# Patient Record
Sex: Male | Born: 1965 | Race: White | Hispanic: No | Marital: Married | Smoking: Never smoker
Health system: Southern US, Community
[De-identification: ages and names within clinical notes are randomized; demographics above are authoritative.]

## PROBLEM LIST (undated history)

## (undated) DIAGNOSIS — Z905 Acquired absence of kidney: Secondary | ICD-10-CM

## (undated) DIAGNOSIS — K219 Gastro-esophageal reflux disease without esophagitis: Secondary | ICD-10-CM

## (undated) DIAGNOSIS — E785 Hyperlipidemia, unspecified: Secondary | ICD-10-CM

## (undated) DIAGNOSIS — E119 Type 2 diabetes mellitus without complications: Secondary | ICD-10-CM

## (undated) HISTORY — DX: Acquired absence of kidney: Z90.5

## (undated) HISTORY — DX: Hyperlipidemia, unspecified: E78.5

## (undated) HISTORY — PX: KIDNEY DONATION: SHX685

## (undated) HISTORY — DX: Type 2 diabetes mellitus without complications: E11.9

## (undated) HISTORY — DX: Gastro-esophageal reflux disease without esophagitis: K21.9

---

## 2011-10-25 ENCOUNTER — Emergency Department: Admission: EM | Admit: 2011-10-25 | Discharge: 2011-10-25 | Payer: 59 | Source: Home / Self Care

## 2014-07-22 DIAGNOSIS — E785 Hyperlipidemia, unspecified: Secondary | ICD-10-CM

## 2014-07-22 DIAGNOSIS — E559 Vitamin D deficiency, unspecified: Secondary | ICD-10-CM | POA: Insufficient documentation

## 2014-07-22 DIAGNOSIS — K219 Gastro-esophageal reflux disease without esophagitis: Secondary | ICD-10-CM | POA: Insufficient documentation

## 2014-07-22 DIAGNOSIS — R7303 Prediabetes: Secondary | ICD-10-CM | POA: Insufficient documentation

## 2014-07-22 DIAGNOSIS — Z905 Acquired absence of kidney: Secondary | ICD-10-CM

## 2014-07-22 HISTORY — DX: Acquired absence of kidney: Z90.5

## 2014-07-22 HISTORY — DX: Hyperlipidemia, unspecified: E78.5

## 2015-10-06 ENCOUNTER — Ambulatory Visit (INDEPENDENT_AMBULATORY_CARE_PROVIDER_SITE_OTHER): Payer: Managed Care, Other (non HMO) | Admitting: Family Medicine

## 2015-10-06 ENCOUNTER — Encounter: Payer: Self-pay | Admitting: Family Medicine

## 2015-10-06 VITALS — BP 127/86 | HR 65 | Ht 63.0 in | Wt 158.0 lb

## 2015-10-06 DIAGNOSIS — K219 Gastro-esophageal reflux disease without esophagitis: Secondary | ICD-10-CM | POA: Diagnosis not present

## 2015-10-06 DIAGNOSIS — M7712 Lateral epicondylitis, left elbow: Secondary | ICD-10-CM

## 2015-10-06 DIAGNOSIS — Z905 Acquired absence of kidney: Secondary | ICD-10-CM

## 2015-10-06 DIAGNOSIS — M7710 Lateral epicondylitis, unspecified elbow: Secondary | ICD-10-CM | POA: Insufficient documentation

## 2015-10-06 DIAGNOSIS — E559 Vitamin D deficiency, unspecified: Secondary | ICD-10-CM

## 2015-10-06 DIAGNOSIS — R7303 Prediabetes: Secondary | ICD-10-CM

## 2015-10-06 DIAGNOSIS — E785 Hyperlipidemia, unspecified: Secondary | ICD-10-CM

## 2015-10-06 NOTE — Patient Instructions (Signed)
Thank you for coming in today. Get fasting labs soon.  Do the exercises we discussed as much as possible.  Try using a counter-force brace for your elbow.  Call if not better in 4 weeks and we will get you to hand therapy.  Continue diet and exercise.  Tumeric can be helpful as well sometimes.   Lateral Epicondylitis With Rehab Lateral epicondylitis involves inflammation and pain around the outer portion of the elbow. The pain is caused by inflammation of the tendons in the forearm that bring back (extend) the wrist. Lateral epicondylitis is also called tennis elbow, because it is very common in tennis players. However, it may occur in any individual who extends the wrist repetitively. If lateral epicondylitis is left untreated, it may become a chronic problem. SYMPTOMS   Pain, tenderness, and inflammation on the outer (lateral) side of the elbow.  Pain or weakness with gripping activities.  Pain that increases with wrist-twisting motions (playing tennis, using a screwdriver, opening a door or a jar).  Pain with lifting objects, including a coffee cup. CAUSES  Lateral epicondylitis is caused by inflammation of the tendons that extend the wrist. Causes of injury may include:  Repetitive stress and strain on the muscles and tendons that extend the wrist.  Sudden change in activity level or intensity.  Incorrect grip in racquet sports.  Incorrect grip size of racquet (often too large).  Incorrect hitting position or technique (usually backhand, leading with the elbow).  Using a racket that is too heavy. RISK INCREASES WITH:  Sports or occupations that require repetitive and/or strenuous forearm and wrist movements (tennis, squash, racquetball, carpentry).  Poor wrist and forearm strength and flexibility.  Failure to warm up properly before activity.  Resuming activity before healing, rehabilitation, and conditioning are complete. PREVENTION   Warm up and stretch properly  before activity.  Maintain physical fitness:  Strength, flexibility, and endurance.  Cardiovascular fitness.  Wear and use properly fitted equipment.  Learn and use proper technique and have a coach correct improper technique.  Wear a tennis elbow (counterforce) brace. PROGNOSIS  The course of this condition depends on the degree of the injury. If treated properly, acute cases (symptoms lasting less than 4 weeks) are often resolved in 2 to 6 weeks. Chronic (longer lasting cases) often resolve in 3 to 6 months but may require physical therapy. RELATED COMPLICATIONS   Frequently recurring symptoms, resulting in a chronic problem. Properly treating the problem the first time decreases frequency of recurrence.  Chronic inflammation, scarring tendon degeneration, and partial tendon tear, requiring surgery.  Delayed healing or resolution of symptoms. TREATMENT  Treatment first involves the use of ice and medicine to reduce pain and inflammation. Strengthening and stretching exercises may help reduce discomfort if performed regularly. These exercises may be performed at home if the condition is an acute injury. Chronic cases may require a referral to a physical therapist for evaluation and treatment. Your caregiver may advise a corticosteroid injection to help reduce inflammation. Rarely, surgery is needed. MEDICATION  If pain medicine is needed, nonsteroidal anti-inflammatory medicines (aspirin and ibuprofen), or other minor pain relievers (acetaminophen), are often advised.  Do not take pain medicine for 7 days before surgery.  Prescription pain relievers may be given, if your caregiver thinks they are needed. Use only as directed and only as much as you need.  Corticosteroid injections may be recommended. These injections should be reserved only for the most severe cases, because they can only be given a certain  number of times. HEAT AND COLD  Cold treatment (icing) should be applied  for 10 to 15 minutes every 2 to 3 hours for inflammation and pain, and immediately after activity that aggravates your symptoms. Use ice packs or an ice massage.  Heat treatment may be used before performing stretching and strengthening activities prescribed by your caregiver, physical therapist, or athletic trainer. Use a heat pack or a warm water soak. SEEK MEDICAL CARE IF: Symptoms get worse or do not improve in 2 weeks, despite treatment. EXERCISES  RANGE OF MOTION (ROM) AND STRETCHING EXERCISES - Epicondylitis, Lateral (Tennis Elbow) These exercises may help you when beginning to rehabilitate your injury. Your symptoms may go away with or without further involvement from your physician, physical therapist, or athletic trainer. While completing these exercises, remember:   Restoring tissue flexibility helps normal motion to return to the joints. This allows healthier, less painful movement and activity.  An effective stretch should be held for at least 30 seconds.  A stretch should never be painful. You should only feel a gentle lengthening or release in the stretched tissue. RANGE OF MOTION - Wrist Flexion, Active-Assisted  Extend your right / left elbow with your fingers pointing down.*  Gently pull the back of your hand towards you, until you feel a gentle stretch on the top of your forearm.  Hold this position for __________ seconds. Repeat __________ times. Complete this exercise __________ times per day.  *If directed by your physician, physical therapist or athletic trainer, complete this stretch with your elbow bent, rather than extended. RANGE OF MOTION - Wrist Extension, Active-Assisted  Extend your right / left elbow and turn your palm upwards.*  Gently pull your palm and fingertips back, so your wrist extends and your fingers point more toward the ground.  You should feel a gentle stretch on the inside of your forearm.  Hold this position for __________ seconds. Repeat  __________ times. Complete this exercise __________ times per day. *If directed by your physician, physical therapist or athletic trainer, complete this stretch with your elbow bent, rather than extended. STRETCH - Wrist Flexion  Place the back of your right / left hand on a tabletop, leaving your elbow slightly bent. Your fingers should point away from your body.  Gently press the back of your hand down onto the table by straightening your elbow. You should feel a stretch on the top of your forearm.  Hold this position for __________ seconds. Repeat __________ times. Complete this stretch __________ times per day.  STRETCH - Wrist Extension   Place your right / left fingertips on a tabletop, leaving your elbow slightly bent. Your fingers should point backwards.  Gently press your fingers and palm down onto the table by straightening your elbow. You should feel a stretch on the inside of your forearm.  Hold this position for __________ seconds. Repeat __________ times. Complete this stretch __________ times per day.  STRENGTHENING EXERCISES - Epicondylitis, Lateral (Tennis Elbow) These exercises may help you when beginning to rehabilitate your injury. They may resolve your symptoms with or without further involvement from your physician, physical therapist, or athletic trainer. While completing these exercises, remember:   Muscles can gain both the endurance and the strength needed for everyday activities through controlled exercises.  Complete these exercises as instructed by your physician, physical therapist or athletic trainer. Increase the resistance and repetitions only as guided.  You may experience muscle soreness or fatigue, but the pain or discomfort you are trying  to eliminate should never worsen during these exercises. If this pain does get worse, stop and make sure you are following the directions exactly. If the pain is still present after adjustments, discontinue the exercise  until you can discuss the trouble with your caregiver. STRENGTH - Wrist Flexors  Sit with your right / left forearm palm-up and fully supported on a table or countertop. Your elbow should be resting below the height of your shoulder. Allow your wrist to extend over the edge of the surface.  Loosely holding a __________ weight, or a piece of rubber exercise band or tubing, slowly curl your hand up toward your forearm.  Hold this position for __________ seconds. Slowly lower the wrist back to the starting position in a controlled manner. Repeat __________ times. Complete this exercise __________ times per day.  STRENGTH - Wrist Extensors  Sit with your right / left forearm palm-down and fully supported on a table or countertop. Your elbow should be resting below the height of your shoulder. Allow your wrist to extend over the edge of the surface.  Loosely holding a __________ weight, or a piece of rubber exercise band or tubing, slowly curl your hand up toward your forearm.  Hold this position for __________ seconds. Slowly lower the wrist back to the starting position in a controlled manner. Repeat __________ times. Complete this exercise __________ times per day.  STRENGTH - Ulnar Deviators  Stand with a ____________________ weight in your right / left hand, or sit while holding a rubber exercise band or tubing, with your healthy arm supported on a table or countertop.  Move your wrist, so that your pinkie travels toward your forearm and your thumb moves away from your forearm.  Hold this position for __________ seconds and then slowly lower the wrist back to the starting position. Repeat __________ times. Complete this exercise __________ times per day STRENGTH - Radial Deviators  Stand with a ____________________ weight in your right / left hand, or sit while holding a rubber exercise band or tubing, with your injured arm supported on a table or countertop.  Raise your hand upward in  front of you or pull up on the rubber tubing.  Hold this position for __________ seconds and then slowly lower the wrist back to the starting position. Repeat __________ times. Complete this exercise __________ times per day. STRENGTH - Forearm Supinators   Sit with your right / left forearm supported on a table, keeping your elbow below shoulder height. Rest your hand over the edge, palm down.  Gently grip a hammer or a soup ladle.  Without moving your elbow, slowly turn your palm and hand upward to a "thumbs-up" position.  Hold this position for __________ seconds. Slowly return to the starting position. Repeat __________ times. Complete this exercise __________ times per day.  STRENGTH - Forearm Pronators   Sit with your right / left forearm supported on a table, keeping your elbow below shoulder height. Rest your hand over the edge, palm up.  Gently grip a hammer or a soup ladle.  Without moving your elbow, slowly turn your palm and hand upward to a "thumbs-up" position.  Hold this position for __________ seconds. Slowly return to the starting position. Repeat __________ times. Complete this exercise __________ times per day.  STRENGTH - Grip  Grasp a tennis ball, a dense sponge, or a large, rolled sock in your hand.  Squeeze as hard as you can, without increasing any pain.  Hold this position for __________  seconds. Release your grip slowly. Repeat __________ times. Complete this exercise __________ times per day.  STRENGTH - Elbow Extensors, Isometric  Stand or sit upright, on a firm surface. Place your right / left arm so that your palm faces your stomach, and it is at the height of your waist.  Place your opposite hand on the underside of your forearm. Gently push up as your right / left arm resists. Push as hard as you can with both arms, without causing any pain or movement at your right / left elbow. Hold this stationary position for __________ seconds. Gradually  release the tension in both arms. Allow your muscles to relax completely before repeating.   This information is not intended to replace advice given to you by your health care provider. Make sure you discuss any questions you have with your health care provider.   Document Released: 03/12/2005 Document Revised: 04/02/2014 Document Reviewed: 06/24/2008 Elsevier Interactive Patient Education [employer]

## 2015-10-06 NOTE — Progress Notes (Signed)
       Darius RousJohn Roberts is a 50 y.o. male who [facility] today to establish care.  He is doing well.  His only complaint is left lateral elbow pain for the past 3 months that he describes as a nagging soreness.  Denies radiation of pain, numbness, tingling, or weakness.  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  He claims te pain is worse after playing his guitar and riding his bike uphill.  Denies specific injury. Patient has been applying ice and heat and taking ibuprofen occasionally with some improvement.  Patient is right handed.  His past medical history is significant for pre-diabetes which he says has been improving over the last several years due to improved diet and exercise habits. 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 He does have a history of hyperlipidemia.     History reviewed. No pertinent past medical history. Past Surgical History  Procedure Laterality Date  . Kidney donation Right    Social History  Substance Use Topics  . Smoking status: Never Smoker   . Smokeless tobacco: Not on file  . Alcohol Use: 0.0 oz/week    0 Standard drinks or equivalent per week   family history includes Diabetes in his mother; Heart disease in his father; Hyperlipidemia in his paternal uncle; Hypertension in his maternal uncle.  ROS as above: Denies headache, fever, chills, chest pain, shortness of breath, abdominal pain, nausea, vomiting, diarrhea, and lower extremity edema.    Medications: No current outpatient prescriptions on file.   No current facility-administered medications for this visit.   Allergies  Allergen Reactions  . Amoxicillin Rash  . Promethazine Other (See Comments)     Exam:  BP 127/86 mmHg  Pulse 65  Ht 5\' 3"  (1.6 m)  Wt 158 lb (71.668 kg)  BMI 28.00 kg/m2 Gen: Well  NAD HEENT: EOMI,  MMM Lungs: Normal work of breathing. CTABL Heart: RRR no MRG Abd: NABS, Soft. Nondistended, Nontender Exts: Brisk capillary refill, warm and well perfused.  MSK left elbow: Normal appearance Normal motion Tender to palpation lateral epicondyle Pain with resisted left wrist extension with a fully extended elbow.   Normal strength and sensation.    No results found for this or any previous visit (from the past 24 hour(s)). No results found.  Assessment and Plan: 50 y.o. 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  Also on the differential is osteoarthritis and cervical radiculopathy, but these are unlikely given his age and lack of radicular pain or neurological deficits. - Advised the patient to perform eccentric strengthening exercises and continue his current activities as tolerated.   Additionally we'll use counter force brace - Follow up in 4 weeks if not improved for formal PT referral.    - A1c, fasting lipids, CMP, CBC, TSH, Vitamin D  Discussed warning signs or symptoms. Please see discharge instructions. Patient expresses understanding.

## 2016-05-15 ENCOUNTER — Telehealth: Payer: Self-pay | Admitting: Family Medicine

## 2016-05-15 DIAGNOSIS — K219 Gastro-esophageal reflux disease without esophagitis: Secondary | ICD-10-CM

## 2016-05-15 DIAGNOSIS — E785 Hyperlipidemia, unspecified: Secondary | ICD-10-CM

## 2016-05-15 DIAGNOSIS — E559 Vitamin D deficiency, unspecified: Secondary | ICD-10-CM

## 2016-05-15 DIAGNOSIS — R7303 Prediabetes: Secondary | ICD-10-CM

## 2016-05-15 DIAGNOSIS — Z0000 Encounter for general adult medical examination without abnormal findings: Secondary | ICD-10-CM

## 2016-05-15 DIAGNOSIS — Z905 Acquired absence of kidney: Secondary | ICD-10-CM

## 2016-05-15 DIAGNOSIS — Z114 Encounter for screening for human immunodeficiency virus [HIV]: Secondary | ICD-10-CM

## 2016-05-15 NOTE — Telephone Encounter (Signed)
Patient called and scheduled a physical for 05/25/16 at 8:45 am but would like to get basic labs done before and discuss within physical. Please adv

## 2016-05-16 NOTE — Telephone Encounter (Signed)
Labs ordered and will be ready for pickup.

## 2016-05-17 NOTE — Telephone Encounter (Signed)
Information discussed with pt. Pt verbalized understanding. 

## 2016-05-25 ENCOUNTER — Ambulatory Visit (INDEPENDENT_AMBULATORY_CARE_PROVIDER_SITE_OTHER): Payer: Managed Care, Other (non HMO) | Admitting: Family Medicine

## 2016-05-25 ENCOUNTER — Encounter: Payer: Self-pay | Admitting: Family Medicine

## 2016-05-25 VITALS — BP 134/64 | HR 56 | Wt 159.0 lb

## 2016-05-25 DIAGNOSIS — Z0000 Encounter for general adult medical examination without abnormal findings: Secondary | ICD-10-CM

## 2016-05-25 DIAGNOSIS — Z23 Encounter for immunization: Secondary | ICD-10-CM

## 2016-05-25 LAB — CBC
HEMATOCRIT: 44.6 % (ref 38.5–50.0)
HEMOGLOBIN: 15 g/dL (ref 13.2–17.1)
MCH: 30.8 pg (ref 27.0–33.0)
MCHC: 33.6 g/dL (ref 32.0–36.0)
MCV: 91.6 fL (ref 80.0–100.0)
MPV: 9.8 fL (ref 7.5–12.5)
Platelets: 220 10*3/uL (ref 140–400)
RBC: 4.87 MIL/uL (ref 4.20–5.80)
RDW: 13.4 % (ref 11.0–15.0)
WBC: 6 10*3/uL (ref 3.8–10.8)

## 2016-05-25 NOTE — Progress Notes (Signed)
       Darius Roberts is a 51 y.o. male who [facility] Medicine today for well adult visit. Patient presents to clinic today for wellness visit. He is doing generally well with no significant medical problems. He notes occasional left arm radiating pain but he denies any weakness or numbness. He denies any fevers or chills and feels well otherwise.  Patient is active and exercises regularly. He plays the guitar for recreation. He manages a [employer], [employer] store.   Past Medical History:  Diagnosis Date  . 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 Dyslipidemia 07/22/2014   Past Surgical History:  Procedure Laterality Date  . KIDNEY DONATION Right    Social History  Substance Use Topics  . Smoking status: Never Smoker  . Smokeless tobacco: Never Used  . Alcohol use 0.0 oz/week   family history includes Diabetes in his mother; Heart disease in his father; Hyperlipidemia in his paternal uncle; Hypertension in his maternal uncle.  ROS as above:  Medications: Current Outpatient Prescriptions  Medication Sig Dispense Refill  . Ascorbic Acid (VITAMIN C) 100 MG tablet Take 100 mg by mouth daily.    . cholecalciferol (VITAMIN D) 1000 units tablet Take 1,000 Units by mouth daily.    . magnesium citrate SOLN Take 1 Bottle by mouth once.    . Multiple Vitamin (MULTIVITAMIN) capsule Take 1 capsule by mouth daily.    . Omega-3 Fatty Acids (FISH OIL ADULT GUMMIES PO) Take 2 tablets by mouth daily.     No current facility-administered medications for this visit.    Allergies  Allergen Reactions  . Amoxicillin Rash  . Promethazine Other (See Comments)    Health Maintenance Health Maintenance  Topic Date Due  . HIV Screening  12/18/1981  . INFLUENZA VACCINE  05/25/2017 (Originally 10/25/2015)  . TETANUS/TDAP  05/25/2017 (Originally 12/18/1985)      Exam:  BP 134/64   Pulse (!) 56   Wt 159 lb (72.1 kg)   BMI 28.17 kg/m  Gen: Well NAD HEENT: EOMI,  MMM Lungs: Normal work of breathing. CTABL Heart: RRR no MRG Abd: NABS, Soft. Nondistended, Nontender Exts: Brisk capillary refill, warm and well perfused.  MSK: Normal neck motion. Positive left-sided Spurling's test. Normal upper extremity strength sensation reflexes.  No results found for this or any previous visit (from the past 72 hour(s)). No results found.    Assessment and Plan: 51 y.o. male with well adult. Patient is doing well. Fasting labs previously ordered are pending. Tdap given prior to discharge. HIV screening pending. Patient declined influenza vaccine. Condition is generally healthy. We'll follow along left cervical radicular symptoms and workup further if symptoms become more bothersome.   No orders of the defined types were placed in this encounter.    Discussed warning signs or symptoms. Please see discharge instructions. Patient expresses understanding.

## 2016-05-25 NOTE — Addendum Note (Signed)
Addended by: Minna AntisBRIGHAM, EBONY T on: 05/25/2016 02:15 PM   Modules accepted: Orders

## 2016-05-25 NOTE — Patient Instructions (Signed)
Thank you for coming in today. Return yearly or sooner if needed.  We will keep an eye on the arm.   Return sooner if needed.

## 2016-05-26 LAB — LIPID PANEL W/REFLEX DIRECT LDL
Cholesterol: 268 mg/dL — ABNORMAL HIGH (ref <200)
HDL: 47 mg/dL (ref >40)
Non-HDL Cholesterol (Calc): 221 mg/dL — ABNORMAL HIGH (ref <130)
TRIGLYCERIDES: 416 mg/dL — AB (ref <150)
Total CHOL/HDL Ratio: 5.7 Ratio — ABNORMAL HIGH (ref <5.0)

## 2016-05-26 LAB — COMPLETE METABOLIC PANEL WITH GFR
ALBUMIN: 4.1 g/dL (ref 3.6–5.1)
ALK PHOS: 57 U/L (ref 40–115)
ALT: 23 U/L (ref 9–46)
AST: 18 U/L (ref 10–40)
BUN: 18 mg/dL (ref 7–25)
CO2: 28 mmol/L (ref 20–31)
CREATININE: 1.04 mg/dL (ref 0.60–1.35)
Calcium: 9.5 mg/dL (ref 8.6–10.3)
Chloride: 102 mmol/L (ref 98–110)
GFR, EST NON AFRICAN AMERICAN: 84 mL/min (ref >=60)
Glucose, Bld: 128 mg/dL — ABNORMAL HIGH (ref 65–99)
Potassium: 5.1 mmol/L (ref 3.5–5.3)
Sodium: 141 mmol/L (ref 135–146)
Total Bilirubin: 0.6 mg/dL (ref 0.2–1.2)
Total Protein: 6.8 g/dL (ref 6.1–8.1)

## 2016-05-26 LAB — VITAMIN D 25 HYDROXY (VIT D DEFICIENCY, FRACTURES): VIT D 25 HYDROXY: 31 ng/mL (ref 30–100)

## 2016-05-26 LAB — LDL CHOLESTEROL, DIRECT: LDL DIRECT: 174 mg/dL — AB (ref <130)

## 2016-05-26 LAB — HIV ANTIBODY (ROUTINE TESTING W REFLEX): HIV 1&2 Ab, 4th Generation: NONREACTIVE

## 2016-05-28 ENCOUNTER — Encounter: Payer: Self-pay | Admitting: Family Medicine

## 2016-05-28 MED ORDER — ATORVASTATIN CALCIUM 40 MG PO TABS: 40.0000 mg | ORAL_TABLET | Freq: Every day | ORAL | 0 refills | Status: DC

## 2016-05-28 NOTE — Addendum Note (Signed)
Addended by: Rodolph BongOREY, Jhovany Weidinger S on: 05/28/2016 07:10 AM   Modules accepted: Orders

## 2016-05-30 ENCOUNTER — Encounter: Payer: Self-pay | Admitting: Family Medicine

## 2016-08-01 ENCOUNTER — Other Ambulatory Visit: Payer: Self-pay

## 2016-08-01 ENCOUNTER — Encounter: Payer: Self-pay | Admitting: Family Medicine

## 2016-08-01 DIAGNOSIS — E785 Hyperlipidemia, unspecified: Secondary | ICD-10-CM

## 2016-08-01 MED ORDER — ATORVASTATIN CALCIUM 40 MG PO TABS: 40.0000 mg | ORAL_TABLET | Freq: Every day | ORAL | 0 refills | Status: DC

## 2016-09-13 ENCOUNTER — Encounter: Payer: Self-pay | Admitting: Family Medicine

## 2016-09-13 ENCOUNTER — Ambulatory Visit (INDEPENDENT_AMBULATORY_CARE_PROVIDER_SITE_OTHER): Payer: Managed Care, Other (non HMO) | Admitting: Family Medicine

## 2016-09-13 DIAGNOSIS — M7662 Achilles tendinitis, left leg: Secondary | ICD-10-CM

## 2016-09-13 DIAGNOSIS — M7661 Achilles tendinitis, right leg: Secondary | ICD-10-CM | POA: Insufficient documentation

## 2016-09-13 NOTE — Patient Instructions (Signed)
Thank you for coming in today. Please work on the heel lifts.  I recommend a good quality running shoe.  We can consider nitro patches.   Nitroglycerin Protocol   Apply 1/4 nitroglycerin patch to affected area daily.  Change position of patch within the affected area every 24 hours.  You may experience a headache during the first 1-2 weeks of using the patch, these should subside.  If you experience headaches after beginning nitroglycerin patch treatment, you may take your preferred over the counter pain reliever.  Another side effect of the nitroglycerin patch is skin irritation or rash related to patch adhesive.  Please notify our office if you develop more severe headaches or rash, and stop the patch.  Tendon healing with nitroglycerin patch may require 12 to 24 weeks depending on the extent of injury.  Men should not use if taking Viagra, Cialis, or Levitra.   Do not use if you have migraines or rosacea.     Achilles Tendinitis Achilles tendinitis is inflammation of the tough, cord-like band that attaches the lower leg muscles to the heel bone (Achilles tendon). This is usually caused by overusing the tendon and the ankle joint. Achilles tendinitis usually gets better over time with treatment and caring for yourself at home. It can take weeks or months to heal completely. What are the causes? This condition may be caused by:  A sudden increase in exercise or activity, such as running.  Doing the same exercises or activities (such as jumping) over and over.  Not warming up calf muscles before exercising.  Exercising in shoes that are worn out or not made for exercise.  Having arthritis or a bone growth (spur) on the back of the heel bone. This can rub against the tendon and hurt it.  Age-related wear and tear. Tendons become less flexible with age and more likely to be injured.  What are the signs or symptoms? Common symptoms of this condition include:  Pain in the  Achilles tendon or in the back of the leg, just above the heel. The pain usually gets worse with exercise.  Stiffness or soreness in the back of the leg, especially in the morning.  Swelling of the skin over the Achilles tendon.  Thickening of the tendon.  Bone spurs at the bottom of the Achilles tendon, near the heel.  Trouble standing on tiptoe.  How is this diagnosed? This condition is diagnosed based on your symptoms and a physical exam. You may have tests, including:  X-rays.  MRI.  How is this treated? The goal of treatment is to relieve symptoms and help your injury heal. Treatment may include:  Decreasing or stopping activities that caused the tendinitis. This may mean switching to low-impact exercises like biking or swimming.  Icing the injured area.  Doing physical therapy, including strengthening and stretching exercises.  NSAIDs to help relieve pain and swelling.  Using supportive shoes, wraps, heel lifts, or a walking boot (air cast).  Surgery. This may be done if your symptoms do not improve after 6 months.  Using high-energy shock wave impulses to stimulate the healing process (extracorporeal shock wave therapy). This is rare.  Injection of medicines to help relieve inflammation (corticosteroids). This is rare.  Follow these instructions at home: If you have an air cast:  Wear the cast as told by your health care provider. Remove it only as told by your health care provider.  Loosen the cast if your toes tingle, become numb, or turn cold  and blue. Activity  Gradually return to your normal activities once your health care provider approves. Do not do activities that cause pain. ? Consider doing low-impact exercises, like cycling or swimming.  If you have an air cast, ask your health care provider when it is safe for you to drive.  If physical therapy was prescribed, do exercises as told by your health care provider or physical therapist. Managing  pain, stiffness, and swelling  Raise (elevate) your foot above the level of your heart while you are sitting or lying down.  Move your toes often to avoid stiffness and to lessen swelling.  If directed, put ice on the injured area: ? Put ice in a plastic bag. ? Place a towel between your skin and the bag. ? Leave the ice on for 20 minutes, 2-3 times a day General instructions  If directed, wrap your foot with an elastic bandage or other wrap. This can help keep your tendon from moving too much while it heals. Your health care provider will show you how to wrap your foot correctly.  Wear supportive shoes or heel lifts only as told by your health care provider.  Take over-the-counter and prescription medicines only as told by your health care provider.  Keep all follow-up visits as told by your health care provider. This is important. Contact a health care provider if:  You have symptoms that gets worse.  You have pain that does not get better with medicine.  You develop new, unexplained symptoms.  You develop warmth and swelling in your foot.  You have a fever. Get help right away if:  You have a sudden popping sound or sensation in your Achilles tendon followed by severe pain.  You cannot move your toes or foot.  You cannot put any weight on your foot. Summary  Achilles tendinitis is inflammation of the tough, cord-like band that attaches the lower leg muscles to the heel bone (Achilles tendon).  This condition is usually caused by overusing the tendon and the ankle joint. It can also be caused by arthritis or normal aging.  The most common symptoms of this condition include pain, swelling, or stiffness in the Achilles tendon or in the back of the leg.  This condition is usually treated with rest, NSAIDs, and physical therapy. This information is not intended to replace advice given to you by your health care provider. Make sure you discuss any questions you have with  your health care provider. Document Released: 12/20/2004 Document Revised: 01/30/2016 Document Reviewed: 01/30/2016 Elsevier Interactive Patient Education  2017 [employer]

## 2016-09-14 NOTE — Progress Notes (Signed)
   Darius Roberts is a 51 y.o. male who [facility] today for bilateral posterior heel pain.  Darius Roberts notes pain and swelling at the posterior heel right worse than left present for several months. Pain is worse with activity and better with rest. He denies any fevers or chills nausea vomiting or diarrhea. He denies any injury. He has tried some over-the-counter insoles for her shoes which have helped a bit.  Past Medical History:  Diagnosis Date  . 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 Dyslipidemia 07/22/2014   Past Surgical History:  Procedure Laterality Date  . KIDNEY DONATION Right    Social History  Substance Use Topics  . Smoking status: Never Smoker  . Smokeless tobacco: Never Used  . Alcohol use 0.0 oz/week     ROS:  As above   Medications: Current Outpatient Prescriptions  Medication Sig Dispense Refill  . Ascorbic Acid (VITAMIN C) 100 MG tablet Take 100 mg by mouth daily.    Marland Kitchen. atorvastatin (LIPITOR) 40 MG tablet Take 1 tablet (40 mg total) by mouth daily. 90 tablet 0  . cholecalciferol (VITAMIN D) 1000 units tablet Take 1,000 Units by mouth daily.    . magnesium citrate SOLN Take 1 Bottle by mouth once.    . Multiple Vitamin (MULTIVITAMIN) capsule Take 1 capsule by mouth daily.    . Omega-3 Fatty Acids (FISH OIL ADULT GUMMIES PO) Take 2 tablets by mouth daily.     No current facility-administered medications for this visit.    Allergies  Allergen Reactions  . Amoxicillin Rash  . Promethazine Other (See Comments)     Exam:  BP 123/79   Pulse 67   Wt 159 lb (72.1 kg)   BMI 28.17 kg/m  General: Well Developed, well nourished, and in no acute distress.  Neuro/Psych: Alert and oriented x3, extra-ocular muscles intact, able to move all 4 extremities, sensation grossly intact. Skin: Warm and dry, no rashes noted.  Respiratory: Not using accessory muscles, speaking in full  sentences, trachea midline.  Cardiovascular: Pulses palpable, no extremity edema. Abdomen: Does not appear distended. MSK:  Right foot relatively normal-appearing with the exception of slight or swelling at the posterior calcaneus near the insertion of the Achilles tendon. Motion is normal.  Left foot relatively normal-appearing except for swelling and mild tenderness at the posterior calcaneus. Normal foot motion  Limited musculoskeletal ultrasound of the bilateral calcaneus reveal hypoechoic change in the insertion of the Achilles tendon with some hyperechoic calcifications within the tendon insertion. Increased vascular activity. 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  No results found for this or any previous visit (from the past 48 hour(s)). No results found.    Assessment and Plan: 51 y.o. male with Achilles tendinitis bilaterally. 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 Plan for eccentric heel exercises. Recommend running shoes and will consider nitroglycerin patches. Recheck in about 6 weeks.    No orders of the defined types were placed in this encounter.  No orders of the defined types were placed in this encounter.   Discussed warning signs or symptoms. Please see discharge instructions. Patient expresses understanding.

## 2016-09-17 ENCOUNTER — Ambulatory Visit: Payer: Managed Care, Other (non HMO) | Admitting: Family Medicine

## 2016-09-18 ENCOUNTER — Encounter: Payer: Self-pay | Admitting: Family Medicine

## 2016-09-19 MED ORDER — NITROGLYCERIN 0.2 MG/HR TD PT24: MEDICATED_PATCH | TRANSDERMAL | 1 refills | Status: DC

## 2016-10-22 ENCOUNTER — Other Ambulatory Visit: Payer: Self-pay

## 2016-10-22 ENCOUNTER — Encounter: Payer: Self-pay | Admitting: Family Medicine

## 2016-10-22 MED ORDER — NITROGLYCERIN 0.2 MG/HR TD PT24: MEDICATED_PATCH | TRANSDERMAL | 0 refills | Status: DC

## 2016-11-02 ENCOUNTER — Other Ambulatory Visit: Payer: Self-pay

## 2016-11-02 ENCOUNTER — Encounter: Payer: Self-pay | Admitting: Family Medicine

## 2016-11-02 DIAGNOSIS — E785 Hyperlipidemia, unspecified: Secondary | ICD-10-CM

## 2016-11-02 MED ORDER — ATORVASTATIN CALCIUM 40 MG PO TABS: 40.0000 mg | ORAL_TABLET | Freq: Every day | ORAL | 1 refills | Status: DC

## 2016-11-23 ENCOUNTER — Other Ambulatory Visit: Payer: Self-pay | Admitting: Family Medicine

## 2017-01-09 ENCOUNTER — Encounter: Payer: Self-pay | Admitting: Family Medicine

## 2017-01-10 ENCOUNTER — Encounter: Payer: Self-pay | Admitting: Family Medicine

## 2017-01-11 ENCOUNTER — Encounter: Payer: Self-pay | Admitting: Family Medicine

## 2017-01-11 ENCOUNTER — Ambulatory Visit (INDEPENDENT_AMBULATORY_CARE_PROVIDER_SITE_OTHER): Payer: Managed Care, Other (non HMO) | Admitting: Family Medicine

## 2017-01-11 VITALS — BP 126/86 | HR 59 | Wt 156.0 lb

## 2017-01-11 DIAGNOSIS — R739 Hyperglycemia, unspecified: Secondary | ICD-10-CM

## 2017-01-11 DIAGNOSIS — M7662 Achilles tendinitis, left leg: Secondary | ICD-10-CM

## 2017-01-11 DIAGNOSIS — E785 Hyperlipidemia, unspecified: Secondary | ICD-10-CM | POA: Diagnosis not present

## 2017-01-11 DIAGNOSIS — R7303 Prediabetes: Secondary | ICD-10-CM

## 2017-01-11 DIAGNOSIS — M7661 Achilles tendinitis, right leg: Secondary | ICD-10-CM

## 2017-01-11 DIAGNOSIS — Z1211 Encounter for screening for malignant neoplasm of colon: Secondary | ICD-10-CM

## 2017-01-11 MED ORDER — NITROGLYCERIN 0.2 MG/HR TD PT24: MEDICATED_PATCH | TRANSDERMAL | 6 refills | Status: DC

## 2017-01-11 MED ORDER — ATORVASTATIN CALCIUM 40 MG PO TABS: 40.0000 mg | ORAL_TABLET | Freq: Every day | ORAL | 1 refills | Status: DC

## 2017-01-11 NOTE — Patient Instructions (Signed)
Thank you for coming in today. Resume exercises and nitro-patches.  The best shoes for you are ones that feel good.  Get fasting labs in the future.  You should hear from digestive health about colon cancer screening.   Recheck with me in 6 months or sooner if needed.   If not getting better we can do an injection or other treatments.     Nitroglycerin Protocol   Apply 1/4 nitroglycerin patch to affected area daily.  Change position of patch within the affected area every 24 hours.  You may experience a headache during the first 1-2 weeks of using the patch, these should subside.  If you experience headaches after beginning nitroglycerin patch treatment, you may take your preferred over the counter pain reliever.  Another side effect of the nitroglycerin patch is skin irritation or rash related to patch adhesive.  Please notify our office if you develop more severe headaches or rash, and stop the patch.  Tendon healing with nitroglycerin patch may require 12 to 24 weeks depending on the extent of injury.  Men should not use if taking Viagra, Cialis, or Levitra.   Do not use if you have migraines or rosacea.

## 2017-01-11 NOTE — Progress Notes (Signed)
Darius Roberts is a 51 y.o. male who presents to [facility]: [facility] today for follow up right Achilles tendinitis as well as follow-up hyperlipidemia.  Right-sided Achilles tendinitis. Patient is been seen several times for right Achilles tendinitis. He was most recently seen in June where he was recommended to start eccentric heel exercises and was prescribed nitroglycerin patches. He notes he had improvement of symptoms with this protocol but ran out of nitroglycerin patches and the pain has returned some.  Overall he feels quite well.  Hyperlipidemia: Patient takes atorvastatin 40 mg daily. He tolerates the medication well with no significant muscle aches or pain.   Past Medical History:  Diagnosis Date  . 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 Dyslipidemia 07/22/2014   Past Surgical History:  Procedure Laterality Date  . KIDNEY DONATION Right    Social History  Substance Use Topics  . Smoking status: Never Smoker  . Smokeless tobacco: Never Used  . Alcohol use 0.0 oz/week   family history includes Diabetes in his mother; Heart disease in his father; Hyperlipidemia in his paternal uncle; Hypertension in his maternal uncle.  ROS as above:  Medications: Current Outpatient Prescriptions  Medication Sig Dispense Refill  . Ascorbic Acid (VITAMIN C) 100 MG tablet Take 100 mg by mouth daily.    Marland Kitchen atorvastatin (LIPITOR) 40 MG tablet Take 1 tablet (40 mg total) by mouth daily. 90 tablet 1  . cholecalciferol (VITAMIN D) 1000 units tablet Take 1,000 Units by mouth daily.    Marland Kitchen MAGNESIUM PO Take by mouth.    . Multiple Vitamin (MULTIVITAMIN) capsule Take 1 capsule by mouth daily.    . Omega-3 Fatty Acids (FISH OIL ADULT GUMMIES PO) Take 2 tablets by mouth daily.    . nitroGLYCERIN (NITRODUR - DOSED IN MG/24 HR) 0.2 mg/hr patch 1/4 patch to  tendon daily. 30 patch 6   No current facility-administered medications for this visit.    Allergies  Allergen Reactions  . Amoxicillin Rash  . Promethazine Other (See Comments)    Health Maintenance Health Maintenance  Topic Date Due  . COLONOSCOPY  12/18/2016  . INFLUENZA VACCINE  05/25/2017 (Originally 10/24/2016)  . TETANUS/TDAP  05/26/2026  . HIV Screening  Completed     Exam:  BP 126/86   Pulse (!) 59   Wt 156 lb (70.8 kg)   BMI 27.63 kg/m  Gen: Well NAD HEENT: EOMI,  MMM Lungs: Normal work of breathing. CTABL Heart: RRR no MRG Abd: NABS, Soft. Nondistended, Nontender Exts: Brisk capillary refill, warm and well perfused.  Right heel slight nodule at the insertional portion of the Achilles tendon. Mildly tender to palpation. Normal foot motion is intact. Pulses capillary refill and sensation are intact distally.  Limited musculoskeletal ultrasound of the right distal Achilles tendon. The tendon is intact and normal appearing. There swelling superficial to the tendon insertion with hypoechoic fluid change with neovascularity. Some calcifications are also present at the insertion of the tendon. Bony structures are otherwise normal.   Lab Results  Component Value Date   CHOL 268 (H) 05/25/2016   HDL 47 05/25/2016   LDLDIRECT 174 (H) 05/25/2016   TRIG 416 (H) 05/25/2016   CHOLHDL 5.7 (H) 05/25/2016     No results found for this or any previous visit (from the past 72 hour(s)). No results found.    Assessment and Plan: 51 y.o. male with insertional  Achilles tendinitis. Plan to continue nitroglycerin patches and home exercise program as he has had significant symptom improvement. Plan to recheck in 6 months or sooner if needed. We discussed other treatment options as well.  Hyperlipidemia: Patient is tolerating atorvastatin quite well however we have not rechecked lipids following treatment initiation. Plan to check basic fasting labs listed below as part of  normal health maintenance and checkup.  Additionally patient is due for colon cancer screening. Referral to gastroenterology ordered.   Orders Placed This Encounter  Procedures  . CBC  . COMPLETE METABOLIC PANEL WITH GFR  . Lipid Panel w/reflex Direct LDL  . Hemoglobin A1c  . Ambulatory referral to Gastroenterology    Referral Priority:   Routine    Referral Type:   Consultation    Referral Reason:   Specialty Services Required    Number of Visits Requested:   1   Meds ordered this encounter  Medications  . MAGNESIUM PO    Sig: Take by mouth.  . nitroGLYCERIN (NITRODUR - DOSED IN MG/24 HR) 0.2 mg/hr patch    Sig: 1/4 patch to tendon daily.    Dispense:  30 patch    Refill:  6  . atorvastatin (LIPITOR) 40 MG tablet    Sig: Take 1 tablet (40 mg total) by mouth daily.    Dispense:  90 tablet    Refill:  1     Discussed warning signs or symptoms. Please see discharge instructions. Patient expresses understanding.

## 2017-04-30 ENCOUNTER — Encounter: Payer: Self-pay | Admitting: Family Medicine

## 2017-05-09 ENCOUNTER — Encounter: Payer: Self-pay | Admitting: Family Medicine

## 2017-05-09 DIAGNOSIS — E785 Hyperlipidemia, unspecified: Secondary | ICD-10-CM

## 2017-05-09 MED ORDER — ATORVASTATIN CALCIUM 40 MG PO TABS: 40.0000 mg | ORAL_TABLET | Freq: Every day | ORAL | 0 refills | Status: DC

## 2017-05-28 ENCOUNTER — Encounter: Payer: Self-pay | Admitting: Family Medicine

## 2017-05-30 LAB — HM COLONOSCOPY

## 2017-06-04 ENCOUNTER — Encounter: Payer: Self-pay | Admitting: Family Medicine

## 2017-07-12 ENCOUNTER — Ambulatory Visit: Payer: Managed Care, Other (non HMO) | Admitting: Family Medicine

## 2017-08-07 ENCOUNTER — Encounter: Payer: Self-pay | Admitting: Family Medicine

## 2017-08-07 DIAGNOSIS — E785 Hyperlipidemia, unspecified: Secondary | ICD-10-CM

## 2017-08-07 MED ORDER — ATORVASTATIN CALCIUM 40 MG PO TABS: 40.0000 mg | ORAL_TABLET | Freq: Every day | ORAL | 0 refills | Status: DC

## 2017-08-29 ENCOUNTER — Encounter: Payer: Self-pay | Admitting: Family Medicine

## 2017-08-29 ENCOUNTER — Ambulatory Visit (INDEPENDENT_AMBULATORY_CARE_PROVIDER_SITE_OTHER): Payer: Managed Care, Other (non HMO) | Admitting: Family Medicine

## 2017-08-29 VITALS — BP 127/82 | HR 60 | Ht 63.0 in | Wt 156.0 lb

## 2017-08-29 DIAGNOSIS — R7303 Prediabetes: Secondary | ICD-10-CM

## 2017-08-29 DIAGNOSIS — E785 Hyperlipidemia, unspecified: Secondary | ICD-10-CM

## 2017-08-29 DIAGNOSIS — Z905 Acquired absence of kidney: Secondary | ICD-10-CM

## 2017-08-29 DIAGNOSIS — Z0000 Encounter for general adult medical examination without abnormal findings: Secondary | ICD-10-CM

## 2017-08-29 DIAGNOSIS — E1129 Type 2 diabetes mellitus with other diabetic kidney complication: Secondary | ICD-10-CM

## 2017-08-29 NOTE — Patient Instructions (Addendum)
Thank you for coming in today. We will continue to follow along for the head contusion.  Get fasting labs in the near future.  Recheck with me yearly or sooner if needed.

## 2017-08-29 NOTE — Progress Notes (Signed)
Darius RousJohn Roberts is a 52 y.o. male who [facility] today for well adult visit and head contusion.  Darius RuizJohn is doing quite well.  He takes medications listed below and tries to get exercise on a regular basis.  He is pretty happy with his life and denies any significant depressive symptoms.  He does note some irritability with his supervisor at work but overall is quite happy.  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  He notes he suffered a slight abrasion of the top of the head and had a mild headache.  He notes that he is feeling much better now with no fogginess or fuzziness or confusion.   ROS as above:  Exam:  BP 127/82   Pulse 60   Ht 5\' 3"  (1.6 m)   Wt 156 lb (70.8 kg)   BMI 27.63 kg/m  Gen: Well NAD HEENT: EOMI,  MMM Lungs: Normal work of breathing. CTABL Heart: RRR no MRG Abd: NABS, Soft. Nondistended, Nontender Exts: Brisk capillary refill, warm and well perfused.  Neuro: Alert and oriented normal coordination balance gait memory and speech. Psych alert and oriented normal speech thought process and affect.  [facility] 08/29/2017 01/11/2017  Decreased Interest 0 0  Down, Depressed, Hopeless 0 0  PHQ - 2 Score 0 0     Lab and Radiology Results Pending  Assessment and Plan: 52 y.o. male with  Well adult visit doing quite well.  Plan to check basic fasting labs listed below.  Continue exercise and healthy lifestyle.  Continue medications as needed.  Recheck yearly if all is well.   Head contusion: Mild.  Patient is asymptomatic at this time.  Plan for watchful waiting.  Reviewed concussion signs or symptoms.   Orders Placed This Encounter  Procedures  . CBC  . COMPLETE METABOLIC PANEL WITH GFR  . Lipid Panel w/reflex Direct LDL  . Hemoglobin A1c   No  orders of the defined types were placed in this encounter.    Historical information moved to improve visibility of documentation.  Past Medical History:  Diagnosis Date  . 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 Dyslipidemia 07/22/2014   Past Surgical History:  Procedure Laterality Date  . KIDNEY DONATION Right    Social History   Tobacco Use  . Smoking status: Never Smoker  . Smokeless tobacco: Never Used  Substance Use Topics  . Alcohol use: Yes    Alcohol/week: 0.0 oz   family history includes Diabetes in his mother; Heart disease in his father; Hyperlipidemia in his paternal uncle; Hypertension in his maternal uncle.  Medications: Current Outpatient Medications  Medication Sig Dispense Refill  . atorvastatin (LIPITOR) 40 MG tablet Take 1 tablet (40 mg total) by mouth daily. 30 tablet 0  . cholecalciferol (VITAMIN D) 1000 units tablet Take 1,000 Units by mouth daily.    . Multiple Vitamin (MULTIVITAMIN) capsule Take 1 capsule by mouth daily.    . Omega-3 Fatty Acids (FISH OIL) 1000 MG CAPS Take 1,000 mg by mouth 2 (two) times daily.     No current facility-administered medications for this visit.    Allergies  Allergen Reactions  . Amoxicillin Rash  . Promethazine Other (See Comments)    Health Maintenance Health Maintenance  Topic Date Due  .  INFLUENZA VACCINE  10/24/2017  . TETANUS/TDAP  05/26/2026  . COLONOSCOPY  05/31/2027  . HIV Screening  Completed    Discussed warning signs or symptoms. Please see discharge instructions. Patient expresses understanding.

## 2017-08-30 ENCOUNTER — Encounter: Payer: Self-pay | Admitting: Family Medicine

## 2017-09-07 LAB — CBC
HCT: 44 % (ref 38.5–50.0)
HEMOGLOBIN: 15.1 g/dL (ref 13.2–17.1)
MCH: 30.6 pg (ref 27.0–33.0)
MCHC: 34.3 g/dL (ref 32.0–36.0)
MCV: 89.2 fL (ref 80.0–100.0)
MPV: 10.3 fL (ref 7.5–12.5)
Platelets: 196 10*3/uL (ref 140–400)
RBC: 4.93 10*6/uL (ref 4.20–5.80)
RDW: 12.1 % (ref 11.0–15.0)
WBC: 4.5 10*3/uL (ref 3.8–10.8)

## 2017-09-07 LAB — LIPID PANEL W/REFLEX DIRECT LDL
CHOL/HDL RATIO: 3 (calc) (ref <5.0)
CHOLESTEROL: 148 mg/dL (ref <200)
HDL: 49 mg/dL (ref >40)
LDL CHOLESTEROL (CALC): 72 mg/dL
Non-HDL Cholesterol (Calc): 99 mg/dL (calc) (ref <130)
TRIGLYCERIDES: 197 mg/dL — AB (ref <150)

## 2017-09-07 LAB — COMPLETE METABOLIC PANEL WITH GFR
AG Ratio: 2 (calc) (ref 1.0–2.5)
ALBUMIN MSPROF: 4.7 g/dL (ref 3.6–5.1)
ALKALINE PHOSPHATASE (APISO): 64 U/L (ref 40–115)
ALT: 30 U/L (ref 9–46)
AST: 19 U/L (ref 10–35)
BUN: 17 mg/dL (ref 7–25)
CHLORIDE: 101 mmol/L (ref 98–110)
CO2: 29 mmol/L (ref 20–32)
Calcium: 9.3 mg/dL (ref 8.6–10.3)
Creat: 1 mg/dL (ref 0.70–1.33)
GFR, EST AFRICAN AMERICAN: 101 mL/min/{1.73_m2} (ref >=60)
GFR, Est Non African American: 87 mL/min/{1.73_m2} (ref >=60)
GLUCOSE: 156 mg/dL — AB (ref 65–99)
Globulin: 2.3 g/dL (calc) (ref 1.9–3.7)
Potassium: 4.2 mmol/L (ref 3.5–5.3)
Sodium: 138 mmol/L (ref 135–146)
TOTAL PROTEIN: 7 g/dL (ref 6.1–8.1)
Total Bilirubin: 1 mg/dL (ref 0.2–1.2)

## 2017-09-07 LAB — HEMOGLOBIN A1C
Hgb A1c MFr Bld: 7.7 % of total Hgb — ABNORMAL HIGH (ref <5.7)
MEAN PLASMA GLUCOSE: 174 (calc)
eAG (mmol/L): 9.7 (calc)

## 2017-09-09 ENCOUNTER — Encounter: Payer: Self-pay | Admitting: Family Medicine

## 2017-09-09 DIAGNOSIS — E119 Type 2 diabetes mellitus without complications: Secondary | ICD-10-CM

## 2017-09-09 HISTORY — DX: Type 2 diabetes mellitus without complications: E11.9

## 2017-09-09 MED ORDER — METFORMIN HCL ER 750 MG PO TB24: 750.0000 mg | ORAL_TABLET | Freq: Every day | ORAL | 0 refills | Status: DC

## 2017-09-09 NOTE — Addendum Note (Signed)
Addended by: Rodolph BongOREY, Hyder Deman S on: 09/09/2017 07:02 AM   Modules accepted: Orders

## 2017-09-13 MED ORDER — AMBULATORY NON FORMULARY MEDICATION: 3 refills | Status: AC

## 2017-09-13 MED ORDER — AMBULATORY NON FORMULARY MEDICATION: 3 refills | Status: DC

## 2017-09-14 ENCOUNTER — Encounter: Payer: Self-pay | Admitting: Family Medicine

## 2017-09-16 ENCOUNTER — Encounter: Payer: Self-pay | Admitting: Family Medicine

## 2017-10-15 ENCOUNTER — Encounter: Payer: Self-pay | Admitting: Family Medicine

## 2017-11-01 ENCOUNTER — Encounter: Payer: Self-pay | Admitting: Family Medicine

## 2017-11-01 DIAGNOSIS — E785 Hyperlipidemia, unspecified: Secondary | ICD-10-CM

## 2017-11-01 MED ORDER — ATORVASTATIN CALCIUM 40 MG PO TABS: 40.0000 mg | ORAL_TABLET | Freq: Every day | ORAL | 0 refills | Status: DC

## 2017-12-12 LAB — COMPLETE METABOLIC PANEL WITH GFR
AG Ratio: 2.2 (calc) (ref 1.0–2.5)
ALBUMIN MSPROF: 4.6 g/dL (ref 3.6–5.1)
ALT: 27 U/L (ref 9–46)
AST: 20 U/L (ref 10–35)
Alkaline phosphatase (APISO): 50 U/L (ref 40–115)
BUN: 22 mg/dL (ref 7–25)
CALCIUM: 9.7 mg/dL (ref 8.6–10.3)
CO2: 27 mmol/L (ref 20–32)
CREATININE: 0.97 mg/dL (ref 0.70–1.33)
Chloride: 104 mmol/L (ref 98–110)
GFR, EST NON AFRICAN AMERICAN: 91 mL/min/{1.73_m2} (ref >=60)
GFR, Est African American: 105 mL/min/{1.73_m2} (ref >=60)
Globulin: 2.1 g/dL (calc) (ref 1.9–3.7)
Glucose, Bld: 100 mg/dL — ABNORMAL HIGH (ref 65–99)
Potassium: 5.1 mmol/L (ref 3.5–5.3)
Sodium: 139 mmol/L (ref 135–146)
TOTAL PROTEIN: 6.7 g/dL (ref 6.1–8.1)
Total Bilirubin: 1 mg/dL (ref 0.2–1.2)

## 2017-12-12 LAB — CBC
HEMATOCRIT: 45.4 % (ref 38.5–50.0)
HEMOGLOBIN: 15.2 g/dL (ref 13.2–17.1)
MCH: 30.3 pg (ref 27.0–33.0)
MCHC: 33.5 g/dL (ref 32.0–36.0)
MCV: 90.6 fL (ref 80.0–100.0)
MPV: 9.9 fL (ref 7.5–12.5)
Platelets: 177 10*3/uL (ref 140–400)
RBC: 5.01 10*6/uL (ref 4.20–5.80)
RDW: 11.9 % (ref 11.0–15.0)
WBC: 3.6 10*3/uL — AB (ref 3.8–10.8)

## 2017-12-12 LAB — LIPID PANEL W/REFLEX DIRECT LDL
CHOL/HDL RATIO: 2.3 (calc) (ref <5.0)
Cholesterol: 128 mg/dL (ref <200)
HDL: 55 mg/dL (ref >40)
LDL Cholesterol (Calc): 58 mg/dL (calc)
NON-HDL CHOLESTEROL (CALC): 73 mg/dL (ref <130)
Triglycerides: 73 mg/dL (ref <150)

## 2017-12-12 LAB — HEMOGLOBIN A1C
EAG (MMOL/L): 7.6 (calc)
Hgb A1c MFr Bld: 6.4 % of total Hgb — ABNORMAL HIGH (ref <5.7)
MEAN PLASMA GLUCOSE: 137 (calc)

## 2017-12-21 ENCOUNTER — Encounter: Payer: Self-pay | Admitting: Family Medicine

## 2018-01-28 ENCOUNTER — Encounter: Payer: Self-pay | Admitting: Family Medicine

## 2018-01-28 DIAGNOSIS — E785 Hyperlipidemia, unspecified: Secondary | ICD-10-CM

## 2018-01-28 MED ORDER — ATORVASTATIN CALCIUM 40 MG PO TABS: 40.0000 mg | ORAL_TABLET | Freq: Every day | ORAL | 1 refills | Status: DC

## 2018-07-31 ENCOUNTER — Encounter: Payer: Self-pay | Admitting: Family Medicine

## 2018-07-31 DIAGNOSIS — E785 Hyperlipidemia, unspecified: Secondary | ICD-10-CM

## 2018-08-01 ENCOUNTER — Encounter: Payer: Self-pay | Admitting: Family Medicine

## 2018-11-04 ENCOUNTER — Encounter: Payer: Self-pay | Admitting: Family Medicine

## 2018-11-04 DIAGNOSIS — E785 Hyperlipidemia, unspecified: Secondary | ICD-10-CM

## 2018-11-04 MED ORDER — ATORVASTATIN CALCIUM 40 MG PO TABS: 40.0000 mg | ORAL_TABLET | Freq: Every day | ORAL | 3 refills | Status: DC

## 2019-01-01 ENCOUNTER — Encounter: Payer: Self-pay | Admitting: Family Medicine

## 2019-01-02 MED ORDER — NITROGLYCERIN 0.2 MG/HR TD PT24
MEDICATED_PATCH | TRANSDERMAL | 1 refills | Status: DC
Start: 2019-01-02 — End: 2020-05-17

## 2019-05-18 ENCOUNTER — Encounter: Payer: Self-pay | Admitting: Family Medicine

## 2019-08-26 ENCOUNTER — Encounter: Payer: Self-pay | Admitting: Family Medicine

## 2019-08-27 ENCOUNTER — Encounter: Payer: Self-pay | Admitting: Family Medicine

## 2019-08-27 ENCOUNTER — Ambulatory Visit (INDEPENDENT_AMBULATORY_CARE_PROVIDER_SITE_OTHER): Payer: BC Managed Care – PPO | Admitting: Family Medicine

## 2019-08-27 ENCOUNTER — Ambulatory Visit (INDEPENDENT_AMBULATORY_CARE_PROVIDER_SITE_OTHER): Payer: BC Managed Care – PPO

## 2019-08-27 ENCOUNTER — Other Ambulatory Visit: Payer: Self-pay

## 2019-08-27 VITALS — BP 142/94 | HR 80 | Ht 63.0 in | Wt 155.2 lb

## 2019-08-27 DIAGNOSIS — G8929 Other chronic pain: Secondary | ICD-10-CM

## 2019-08-27 DIAGNOSIS — M545 Low back pain: Secondary | ICD-10-CM

## 2019-08-27 NOTE — Progress Notes (Signed)
   I, Christoper Fabian, LAT, ATC, am serving as scribe for Dr. Clementeen Graham.  Darius Roberts is a 54 y.o. male who presents to [facility] today for L-sided low back pain.  He was last seen by Dr. Denyse Amass on 08/29/17 for a primary care visit.  Today, he reports L-sided low back pain x several months.  He denies any specific MOI but does report prior hx of fractured vertebrae from playing soccer as a youth.  He notes that he only has one kidney but denies any pain w/ urination, blood in his urine, etc.  He also notes a fall in 2020 while he was working for [employer] and is unsure if this is related.  Radiating pain: No LE numbness/tingling:  No LE weakness: No Aggravating factors: Nothing in particular Treatments tried: Nothing   Pertinent review of systems: No fevers or chills  Relevant historical information: Hypertension dyslipidemia possible diabetes   Exam:  BP (!) 142/94 (BP Location: Right Arm, Patient Position: Sitting, Cuff Size: Normal)   Pulse 80   Ht 5\' 3"  (1.6 m)   Wt 155 lb 3.2 oz (70.4 kg)   SpO2 98%   BMI 27.49 kg/m  General: Well Developed, well nourished, and in no acute distress.   MSK: L-spine: Normal-appearing Nontender midline. Mildly tender left paraspinal musculature. Normal lumbar motion. Lower extremity strength reflexes and sensation are intact distally.    Lab and Radiology Results X-ray images L-spine obtained today personally and independently reviewed. 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  Probable bilateral pars defects L5-S1 and possibly L4-L5 as well. Mild degenerative changes present on L-spine. No acute fractures. Await formal radiology review     Assessment and Plan: 54 y.o. male with left low back pain ongoing for several months.  Evidence of spondylolisthesis per my read of x-ray today however radiology overread is still pending.  Plan for physical therapy which is very likely to be beneficial.  Recheck in about  6 weeks especially if not improving.  May consider MRI at that time.   PDMP not reviewed this encounter. Orders Placed This Encounter  Procedures  . DG Lumbar Spine Complete    Standing Status:   Future    Number of Occurrences:   1    Standing Expiration Date:   08/26/2020    Order Specific Question:   Reason for Exam (SYMPTOM  OR DIAGNOSIS REQUIRED)    Answer:   eval pain lspine. Suspect spondy    Order Specific Question:   Preferred imaging location?    Answer:   10/26/2020    Order Specific Question:   Radiology Contrast Protocol - do NOT remove file path    Answer:   [email or web address]  . Ambulatory referral to Physical Therapy    Referral Priority:   Routine    Referral Type:   Physical Medicine    Referral Reason:   Specialty Services Required    Requested Specialty:   Physical Therapy   No orders of the defined types were placed in this encounter.    Discussed warning signs or symptoms. Please see discharge instructions. Patient expresses understanding.   The above documentation has been reviewed and is accurate and complete Kyra Searles, M.D.

## 2019-08-27 NOTE — Patient Instructions (Addendum)
Thank you for coming in today. Plan for xray today.  Attend Pt.  Let me know if not better in 4-6 weeks.  Next step is likely MRI.  I will get results of xray to you tomorrow.   Schedule a meet the new doctor visit with Dr Ashley Royalty at [facility]. [phone].

## 2019-08-28 NOTE — Progress Notes (Signed)
No evidence of fracture.  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  If not improved MRI would be helpful here.

## 2019-09-03 ENCOUNTER — Encounter: Payer: Self-pay | Admitting: Family Medicine

## 2019-10-25 ENCOUNTER — Encounter: Payer: Self-pay | Admitting: Family Medicine

## 2019-10-25 DIAGNOSIS — E785 Hyperlipidemia, unspecified: Secondary | ICD-10-CM

## 2019-10-26 MED ORDER — VALACYCLOVIR HCL 1 G PO TABS: 1000.0000 mg | ORAL_TABLET | Freq: Three times a day (TID) | ORAL | 2 refills | Status: DC

## 2019-10-27 MED ORDER — ATORVASTATIN CALCIUM 40 MG PO TABS: 40.0000 mg | ORAL_TABLET | Freq: Every day | ORAL | 3 refills | Status: DC

## 2019-10-27 NOTE — Addendum Note (Signed)
Addended by: Rodolph Bong on: 10/27/2019 07:09 AM   Modules accepted: Orders

## 2019-11-10 ENCOUNTER — Telehealth: Payer: Self-pay | Admitting: Family Medicine

## 2019-11-10 DIAGNOSIS — G8929 Other chronic pain: Secondary | ICD-10-CM

## 2019-11-10 DIAGNOSIS — M545 Low back pain, unspecified: Secondary | ICD-10-CM

## 2019-11-10 NOTE — Telephone Encounter (Signed)
Patient called asking if the referral could be resent over to [facility].

## 2019-11-10 NOTE — Telephone Encounter (Signed)
Referral has been replaced 

## 2020-01-07 ENCOUNTER — Encounter: Payer: Self-pay | Admitting: Family Medicine

## 2020-01-07 DIAGNOSIS — E1129 Type 2 diabetes mellitus with other diabetic kidney complication: Secondary | ICD-10-CM

## 2020-01-07 DIAGNOSIS — E785 Hyperlipidemia, unspecified: Secondary | ICD-10-CM

## 2020-01-07 DIAGNOSIS — Z0000 Encounter for general adult medical examination without abnormal findings: Secondary | ICD-10-CM

## 2020-01-07 NOTE — Telephone Encounter (Signed)
Patient never seen you before. Appt is for follow up on meds, but should probably be a physical. No labs done in 2 years.   I have pended labs, let me know if you are OK to order these although you have not seen this patient yet. Also let me know if you would like for me to change the upcoming appt to a physical?

## 2020-01-08 NOTE — Telephone Encounter (Signed)
Orders signed for upcoming appt.

## 2020-01-12 LAB — CBC
HCT: 45.5 % (ref 38.5–50.0)
Hemoglobin: 15.6 g/dL (ref 13.2–17.1)
MCH: 31.3 pg (ref 27.0–33.0)
MCHC: 34.3 g/dL (ref 32.0–36.0)
MCV: 91.2 fL (ref 80.0–100.0)
MPV: 9.7 fL (ref 7.5–12.5)
Platelets: 194 10*3/uL (ref 140–400)
RBC: 4.99 10*6/uL (ref 4.20–5.80)
RDW: 12.5 % (ref 11.0–15.0)
WBC: 5.4 10*3/uL (ref 3.8–10.8)

## 2020-01-12 LAB — COMPLETE METABOLIC PANEL WITH GFR
AG Ratio: 2.1 (calc) (ref 1.0–2.5)
ALT: 27 U/L (ref 9–46)
AST: 20 U/L (ref 10–35)
Albumin: 4.6 g/dL (ref 3.6–5.1)
Alkaline phosphatase (APISO): 60 U/L (ref 35–144)
BUN: 18 mg/dL (ref 7–25)
CO2: 29 mmol/L (ref 20–32)
Calcium: 9.6 mg/dL (ref 8.6–10.3)
Chloride: 103 mmol/L (ref 98–110)
Creat: 1.02 mg/dL (ref 0.70–1.33)
GFR, Est African American: 97 mL/min/{1.73_m2} (ref >=60)
GFR, Est Non African American: 84 mL/min/{1.73_m2} (ref >=60)
Globulin: 2.2 g/dL (calc) (ref 1.9–3.7)
Glucose, Bld: 129 mg/dL — ABNORMAL HIGH (ref 65–99)
Potassium: 4.3 mmol/L (ref 3.5–5.3)
Sodium: 140 mmol/L (ref 135–146)
Total Bilirubin: 1.3 mg/dL — ABNORMAL HIGH (ref 0.2–1.2)
Total Protein: 6.8 g/dL (ref 6.1–8.1)

## 2020-01-12 LAB — LIPID PANEL W/REFLEX DIRECT LDL
Cholesterol: 144 mg/dL (ref <200)
HDL: 55 mg/dL (ref >=40)
LDL Cholesterol (Calc): 65 mg/dL (calc)
Non-HDL Cholesterol (Calc): 89 mg/dL (calc) (ref <130)
Total CHOL/HDL Ratio: 2.6 (calc) (ref <5.0)
Triglycerides: 166 mg/dL — ABNORMAL HIGH (ref <150)

## 2020-01-12 LAB — HEMOGLOBIN A1C
Hgb A1c MFr Bld: 6.9 % of total Hgb — ABNORMAL HIGH (ref <5.7)
Mean Plasma Glucose: 151 (calc)
eAG (mmol/L): 8.4 (calc)

## 2020-01-12 LAB — MICROALBUMIN / CREATININE URINE RATIO
Creatinine, Urine: 60 mg/dL (ref 20–320)
Microalb Creat Ratio: 15 mcg/mg creat (ref <30)
Microalb, Ur: 0.9 mg/dL

## 2020-01-14 ENCOUNTER — Encounter: Payer: Self-pay | Admitting: Family Medicine

## 2020-01-14 ENCOUNTER — Other Ambulatory Visit: Payer: Self-pay

## 2020-01-14 ENCOUNTER — Ambulatory Visit (INDEPENDENT_AMBULATORY_CARE_PROVIDER_SITE_OTHER): Payer: BC Managed Care – PPO | Admitting: Family Medicine

## 2020-01-14 VITALS — BP 110/76 | HR 75 | Temp 98.4°F | Wt 159.5 lb

## 2020-01-14 DIAGNOSIS — E1129 Type 2 diabetes mellitus with other diabetic kidney complication: Secondary | ICD-10-CM

## 2020-01-14 DIAGNOSIS — E785 Hyperlipidemia, unspecified: Secondary | ICD-10-CM | POA: Diagnosis not present

## 2020-01-14 DIAGNOSIS — Z905 Acquired absence of kidney: Secondary | ICD-10-CM | POA: Diagnosis not present

## 2020-01-14 DIAGNOSIS — Z23 Encounter for immunization: Secondary | ICD-10-CM | POA: Diagnosis not present

## 2020-01-14 MED ORDER — ATORVASTATIN CALCIUM 40 MG PO TABS: 40.0000 mg | ORAL_TABLET | Freq: Every day | ORAL | 3 refills | Status: DC

## 2020-01-14 NOTE — Patient Instructions (Addendum)
Continue to work on diet and activity to control blood sugars.  See me again in about 6 months.     Diabetes Mellitus and Nutrition, Adult When you have diabetes (diabetes mellitus), it is very important to have healthy eating habits because your blood sugar (glucose) levels are greatly affected by what you eat and drink. Eating healthy foods in the appropriate amounts, at about the same times every day, can help you:  Control your blood glucose.  Lower your risk of heart disease.  Improve your blood pressure.  Reach or maintain a healthy weight. Every person with diabetes is different, and each person has different needs for a meal plan. Your health care provider may recommend that you work with a diet and nutrition specialist (dietitian) to make a meal plan that is best for you. Your meal plan may vary depending on factors such as:  The calories you need.  The medicines you take.  Your weight.  Your blood glucose, blood pressure, and cholesterol levels.  Your activity level.  Other health conditions you have, such as heart or kidney disease. How do carbohydrates affect me? Carbohydrates, also called carbs, affect your blood glucose level more than any other type of food. Eating carbs naturally raises the amount of glucose in your blood. Carb counting is a method for keeping track of how many carbs you eat. Counting carbs is important to keep your blood glucose at a healthy level, especially if you use insulin or take certain oral diabetes medicines. It is important to know how many carbs you can safely have in each meal. This is different for every person. Your dietitian can help you calculate how many carbs you should have at each meal and for each snack. Foods that contain carbs include:  Bread, cereal, rice, pasta, and crackers.  Potatoes and corn.  Peas, beans, and lentils.  Milk and yogurt.  Fruit and juice.  Desserts, such as cakes, cookies, ice cream, and  candy. How does alcohol affect me? Alcohol can cause a sudden decrease in blood glucose (hypoglycemia), especially if you use insulin or take certain oral diabetes medicines. Hypoglycemia can be a life-threatening condition. Symptoms of hypoglycemia (sleepiness, dizziness, and confusion) are similar to symptoms of having too much alcohol. If your health care provider says that alcohol is safe for you, follow these guidelines:  Limit alcohol intake to no more than 1 drink per day for nonpregnant women and 2 drinks per day for men. One drink equals 12 oz of beer, 5 oz of wine, or 1 oz of hard liquor.  Do not drink on an empty stomach.  Keep yourself hydrated with water, diet soda, or unsweetened iced tea.  Keep in mind that regular soda, juice, and other mixers may contain a lot of sugar and must be counted as carbs. What are tips for following this plan?  Reading food labels  Start by checking the serving size on the "Nutrition Facts" label of packaged foods and drinks. The amount of calories, carbs, fats, and other nutrients listed on the label is based on one serving of the item. Many items contain more than one serving per package.  Check the total grams (g) of carbs in one serving. You can calculate the number of servings of carbs in one serving by dividing the total carbs by 15. For example, if a food has 30 g of total carbs, it would be equal to 2 servings of carbs.  Check the number of grams (g) of  saturated and trans fats in one serving. Choose foods that have low or no amount of these fats.  Check the number of milligrams (mg) of salt (sodium) in one serving. Most people should limit total sodium intake to less than 2,300 mg per day.  Always check the nutrition information of foods labeled as "low-fat" or "nonfat". These foods may be higher in added sugar or refined carbs and should be avoided.  Talk to your dietitian to identify your daily goals for nutrients listed on the  label. Shopping  Avoid buying canned, premade, or processed foods. These foods tend to be high in fat, sodium, and added sugar.  Shop around the outside edge of the grocery store. This includes fresh fruits and vegetables, bulk grains, fresh meats, and fresh dairy. Cooking  Use low-heat cooking methods, such as baking, instead of high-heat cooking methods like deep frying.  Cook using healthy oils, such as olive, canola, or sunflower oil.  Avoid cooking with butter, cream, or high-fat meats. Meal planning  Eat meals and snacks regularly, preferably at the same times every day. Avoid going long periods of time without eating.  Eat foods high in fiber, such as fresh fruits, vegetables, beans, and whole grains. Talk to your dietitian about how many servings of carbs you can eat at each meal.  Eat 4-6 ounces (oz) of lean protein each day, such as lean meat, chicken, fish, eggs, or tofu. One oz of lean protein is equal to: ? 1 oz of meat, chicken, or fish. ? 1 egg. ?  cup of tofu.  Eat some foods each day that contain healthy fats, such as avocado, nuts, seeds, and fish. Lifestyle  Check your blood glucose regularly.  Exercise regularly as told by your health care provider. This may include: ? 150 minutes of moderate-intensity or vigorous-intensity exercise each week. This could be brisk walking, biking, or water aerobics. ? Stretching and doing strength exercises, such as yoga or weightlifting, at least 2 times a week.  Take medicines as told by your health care provider.  Do not use any products that contain nicotine or tobacco, such as cigarettes and e-cigarettes. If you need help quitting, ask your health care provider.  Work with a Social worker or diabetes educator to identify strategies to manage stress and any emotional and social challenges. Questions to ask a health care provider  Do I need to meet with a diabetes educator?  Do I need to meet with a dietitian?  What  number can I call if I have questions?  When are the best times to check my blood glucose? Where to find more information:  American Diabetes Association: diabetes.org  Academy of Nutrition and Dietetics: www.eatright.[employer] of Diabetes and Digestive and Kidney Diseases (NIH): [employer] Summary  A healthy meal plan will help you control your blood glucose and maintain a healthy lifestyle.  Working with a diet and nutrition specialist (dietitian) can help you make a meal plan that is best for you.  Keep in mind that carbohydrates (carbs) and alcohol have immediate effects on your blood glucose levels. It is important to count carbs and to use alcohol carefully. This information is not intended to replace advice given to you by your health care provider. Make sure you discuss any questions you have with your health care provider. Document Revised: 02/22/2017 Document Reviewed: 04/16/2016 Elsevier Patient Education  2020 [employer].

## 2020-01-14 NOTE — Telephone Encounter (Signed)
No, it should still be the same.  I have sent again.

## 2020-01-14 NOTE — Assessment & Plan Note (Signed)
Cholesterol is well managed with atorvastatin, continue at current strength.

## 2020-01-14 NOTE — Assessment & Plan Note (Signed)
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  Renal function stable.  Stressed importance of diabetes control with solitary kidney.

## 2020-01-14 NOTE — Assessment & Plan Note (Signed)
A1c has slightly increased since last visit.   He doesn't want to start medication and I discussed with him that he should make significant changes to his diet and activity level to improve his blood sugars.

## 2020-01-14 NOTE — Progress Notes (Signed)
Darius Roberts - 54 y.o. male MRN [id number]  Date of birth: March 25, 1966  Subjective Chief Complaint  Patient presents with  . Establish Care    HPI Darius Roberts is a 54 y.o. male here today for follow up visit. He has a history of T2DM and HLD.  He reports that he is feeling well today.  He does not have any new concerns today.  He did see Dr. Denyse Amass a couple of months ago due to shingles which resolved with valtrex.    He had labs completed prior to his appt today which we reviewed. His a1c is up compared to his last visit.  He had been prescribed metformin but he never picked this up because he wanted to control this on his on.  He was working for [employer] and weight was down but unfortunately had a fall and injured himself.    He is taking atorvastatin for management of lipids.  His recent LDL was 65 with HDL of 55 and TG of 166.  He denies any side effects related to atorvastatin use.    ROS:  A comprehensive ROS was completed and negative except as noted per HPI  Allergies  Allergen Reactions  . Amoxicillin Rash  . Promethazine Other (See Comments)    Past Medical History:  Diagnosis Date  . 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 Diabetes (HCC) 09/09/2017  . Dyslipidemia 07/22/2014    Past Surgical History:  Procedure Laterality Date  . KIDNEY DONATION Right     Social History   Socioeconomic History  . Marital status: Married    Spouse name: Not on file  . Number of children: 3  . Years of education: Not on file  . Highest education level: Not on file  Occupational History  . Not on file  Tobacco Use  . Smoking status: Never Smoker  . Smokeless tobacco: Never Used  Substance and Sexual Activity  . Alcohol use: Yes    Alcohol/week: 0.0 standard drinks  . Drug use: Yes  . Sexual activity: Yes    Partners: Female  Other Topics Concern  . Not on file  Social History Narrative  . Not on file   Social Determinants of Health    Financial Resource Strain:   . Difficulty of Paying Living Expenses: Not on file  Food Insecurity:   . Worried About Programme researcher, broadcasting/film/video in the Last Year: Not on file  . Ran Out of Food in the Last Year: Not on file  Transportation Needs:   . Lack of Transportation (Medical): Not on file  . Lack of Transportation (Non-Medical): Not on file  Physical Activity:   . Days of Exercise per Week: Not on file  . Minutes of Exercise per Session: Not on file  Stress:   . Feeling of Stress : Not on file  Social Connections:   . Frequency of Communication with Friends and Family: Not on file  . Frequency of Social Gatherings with Friends and Family: Not on file  . Attends Religious Services: Not on file  . Active Member of Clubs or Organizations: Not on file  . Attends Banker Meetings: Not on file  . Marital Status: Not on file    Family History  Problem Relation Age of Onset  . Diabetes Mother   . Heart disease Father   . Hypertension Maternal Uncle   . Hyperlipidemia Paternal Uncle     Health Maintenance  Topic Date Due  . Hepatitis C Screening  Never done  . PNEUMOCOCCAL POLYSACCHARIDE VACCINE AGE 71-64 HIGH RISK  Never done  . FOOT EXAM  Never done  . OPHTHALMOLOGY EXAM  Never done  . HEMOGLOBIN A1C  07/11/2020  . URINE MICROALBUMIN  01/10/2021  . TETANUS/TDAP  05/26/2026  . COLONOSCOPY  05/31/2027  . INFLUENZA VACCINE  Completed  . COVID-19 Vaccine  Completed  . HIV Screening  Completed     ----------------------------------------------------------------------------------------------------------------------------------------------------------------------------------------------------------------- Physical Exam BP 110/76 (BP Location: Left Arm, Patient Position: Sitting, Cuff Size: Normal)   Pulse 75   Temp 98.4 F (36.9 C)   Wt 159 lb 8 oz (72.3 kg)   SpO2 99%   BMI 28.25 kg/m   Physical Exam Constitutional:      Appearance: Normal appearance.   HENT:     Head: Normocephalic and atraumatic.  Eyes:     General: No scleral icterus. Cardiovascular:     Rate and Rhythm: Normal rate and regular rhythm.  Pulmonary:     Effort: Pulmonary effort is normal.     Breath sounds: Normal breath sounds.  Neurological:     General: No focal deficit present.     Mental Status: He is alert.  Psychiatric:        Mood and Affect: Mood normal.        Behavior: Behavior normal.     ------------------------------------------------------------------------------------------------------------------------------------------------------------------------------------------------------------------- Assessment and Plan  Diabetes (HCC) A1c has slightly increased since last visit.   He doesn't want to start medication and I discussed with him that he should make significant changes to his diet and activity level to improve his blood sugars.     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  Renal function stable.  Stressed importance of diabetes control with solitary kidney.    Dyslipidemia Cholesterol is well managed with atorvastatin, continue at current strength.    Meds ordered this encounter  Medications  . atorvastatin (LIPITOR) 40 MG tablet    Sig: Take 1 tablet (40 mg total) by mouth daily.    Dispense:  90 tablet    Refill:  3    Return in about 6 months (around 07/14/2020) for T2DM/HLD.    This visit occurred during the SARS-CoV-2 public health emergency.  Safety protocols were in place, including screening questions prior to the visit, additional usage of staff PPE, and extensive cleaning of exam room while observing appropriate contact time as indicated for disinfecting solutions.

## 2020-02-22 ENCOUNTER — Encounter: Payer: Self-pay | Admitting: Family Medicine

## 2020-05-16 ENCOUNTER — Encounter: Payer: Self-pay | Admitting: Family Medicine

## 2020-05-17 ENCOUNTER — Ambulatory Visit (INDEPENDENT_AMBULATORY_CARE_PROVIDER_SITE_OTHER): Payer: BC Managed Care – PPO

## 2020-05-17 ENCOUNTER — Ambulatory Visit: Payer: BC Managed Care – PPO | Admitting: Family Medicine

## 2020-05-17 ENCOUNTER — Encounter: Payer: Self-pay | Admitting: Family Medicine

## 2020-05-17 ENCOUNTER — Other Ambulatory Visit: Payer: Self-pay

## 2020-05-17 ENCOUNTER — Ambulatory Visit: Payer: Self-pay

## 2020-05-17 VITALS — BP 130/86 | HR 78 | Ht 63.0 in | Wt 162.2 lb

## 2020-05-17 DIAGNOSIS — G8929 Other chronic pain: Secondary | ICD-10-CM

## 2020-05-17 DIAGNOSIS — M25512 Pain in left shoulder: Secondary | ICD-10-CM

## 2020-05-17 NOTE — Progress Notes (Addendum)
I, Christoper Fabian, LAT, ATC, am serving as scribe for Dr. Clementeen Graham.  Darius Roberts is a 55 y.o. male who presents to [facility] at [facility] today for L shoulder pain.  He was last seen by Dr. Denyse Amass on 08/27/19 for LBP.  Since then, pt reports sustaining a L shoulder injury in fall 2021 when he was working for [employer] and slipped and fell, landing on his L shoulder .  He locates his pain to his entire L shoulder w/ pain moving from ant to post shoulder and also into his L axilla.  He has been going to PT since Aug 2021 for his L shoulder w/ Kenard Gower at [facility] but feels that he is plateauing.  L shoulder mechanical symptoms: No Aggravating factors: L shoulder flexion AROM beyond 130/140 deg; L shoulder functional ER w/ hands behind head position;  Treatments tried: PT at [facility] including dry needling;   Pertinent review of systems: No fevers or chills  Relevant historical information: Diabetes well controlled with diet alone.   Exam:  BP 130/86 (BP Location: Right Arm, Patient Position: Sitting, Cuff Size: Normal)   Pulse 78   Ht 5\' 3"  (1.6 m)   Wt 162 lb 3.2 oz (73.6 kg)   SpO2 97%   BMI 28.73 kg/m  General: Well Developed, well nourished, and in no acute distress.   MSK: Left shoulder normal-appearing Nontender. Range of motion abduction 90 degrees.  External rotation 10 degrees beyond neutral position internal rotation lumbar spine. Strength intact within limits of range of motion. Pulses capillary fill and sensation are intact distally.    Lab and Radiology Results  X-ray images left shoulder obtained today personally and independently reviewed. No fractures or significant degenerative joint disease. Await formal radiology review  Procedure: Real-time Ultrasound Guided Injection of left shoulder glenohumeral joint posterior approach Device: Philips Affiniti 50G Images permanently stored and available for review in PACS Verbal informed  consent obtained.  Discussed risks and benefits of procedure. Warned about infection bleeding damage to structures skin hypopigmentation and fat atrophy among others. Patient expresses understanding and agreement Time-out conducted.   Noted no overlying erythema, induration, or other signs of local infection.   Skin prepped in a sterile fashion.   Local anesthesia: Topical Ethyl chloride.   With sterile technique and under real time ultrasound guidance:  40 mg of Kenalog and 2 mL of Marcaine injected into joint. Fluid seen entering the joint capsule.   Completed without difficulty   Pain immediately resolved suggesting accurate placement of the medication.   Advised to call if fevers/chills, erythema, induration, drainage, or persistent bleeding.   Images permanently stored and available for review in the ultrasound unit.  Impression: Technically successful ultrasound guided injection.        Assessment and Plan: 55 y.o. male with left shoulder pain and lack of range of motion after fall.  This is concerning for adhesive capsulitis.  Is possible patient does have a rotator cuff tear hiding in the adhesive capsulitis.  He has failed to improve with dedicated trial of physical therapy.  Plan for trial glenohumeral injection and continued home exercise program and PT.  If not better in 1 month he will notify me and I will proceed to MRI to further characterize cause of pain.  This injury did occur at work but was never formally addressed as a 40. injury.   PDMP not reviewed this encounter. Orders Placed This Encounter  Procedures  . Teacher, adult education  LIMITED JOINT SPACE STRUCTURES UP LEFT(NO LINKED CHARGES)    Order Specific Question:   Reason for Exam (SYMPTOM  OR DIAGNOSIS REQUIRED)    Answer:   L shoulder pain    Order Specific Question:   Preferred imaging location?    Answer:   Adult nurse [facility]  . DG Shoulder Left    Standing Status:   Future    Number of  Occurrences:   1    Standing Expiration Date:   05/17/2021    Order Specific Question:   Reason for Exam (SYMPTOM  OR DIAGNOSIS REQUIRED)    Answer:   eval shoulder pain    Order Specific Question:   Preferred imaging location?    Answer:   [facility]   No orders of the defined types were placed in this encounter.    Discussed warning signs or symptoms. Please see discharge instructions. Patient expresses understanding.   The above documentation has been reviewed and is accurate and complete Clementeen Graham, M.D.    Addendum: Revised on February 24 2 corrected dates from 2020 to 2021

## 2020-05-17 NOTE — Patient Instructions (Addendum)
  Thank you for coming in today.  Call or go to the ER if you develop a large red swollen joint with extreme pain or oozing puss.   Please get an Xray today before you leave  If not better let me know and I will order and MRI.

## 2020-05-18 ENCOUNTER — Encounter: Payer: Self-pay | Admitting: Family Medicine

## 2020-05-18 NOTE — Telephone Encounter (Signed)
Insurance card added to documents.

## 2020-05-19 NOTE — Progress Notes (Signed)
Left shoulder x-ray looks normal to radiology

## 2020-07-04 ENCOUNTER — Encounter: Payer: Self-pay | Admitting: Family Medicine

## 2020-07-14 ENCOUNTER — Encounter: Payer: Self-pay | Admitting: Family Medicine

## 2020-07-14 ENCOUNTER — Other Ambulatory Visit: Payer: Self-pay

## 2020-07-14 ENCOUNTER — Ambulatory Visit: Payer: BC Managed Care – PPO | Admitting: Family Medicine

## 2020-07-14 VITALS — BP 143/74 | HR 72 | Temp 98.0°F | Ht 63.0 in | Wt 161.6 lb

## 2020-07-14 DIAGNOSIS — E1129 Type 2 diabetes mellitus with other diabetic kidney complication: Secondary | ICD-10-CM

## 2020-07-14 DIAGNOSIS — Z23 Encounter for immunization: Secondary | ICD-10-CM | POA: Diagnosis not present

## 2020-07-14 DIAGNOSIS — E785 Hyperlipidemia, unspecified: Secondary | ICD-10-CM

## 2020-07-14 DIAGNOSIS — E119 Type 2 diabetes mellitus without complications: Secondary | ICD-10-CM

## 2020-07-14 LAB — POCT GLYCOSYLATED HEMOGLOBIN (HGB A1C): Hemoglobin A1C: 7.5 % — AB (ref 4.0–5.6)

## 2020-07-14 NOTE — Assessment & Plan Note (Signed)
Continues to tolerate atorvastatin well.  Updated lipid and LFT's at next visit.

## 2020-07-14 NOTE — Progress Notes (Signed)
Darius Roberts - 56 y.o. male MRN [id number]  Date of birth: Aug 09, 1965  Subjective Chief Complaint  Patient presents with  . Diabetes  . Hyperlipidemia    HPI Darius Roberts is a 55 y.o. male here today for follow up of diabetes and HLD.    He is not currently taking anything for treatment of diabetes.  He prefers to avoid medication, especially metformin, if possible.  He denies symptoms related to this diabetes.    He is tolerating atorvastatin well for management of HLD.  He denies side effects related to this including myalgias.     He has had first shingrix vaccine and had fatigue afterwards.  He would like to go a head and get second vaccine.  ROS:  A comprehensive ROS was completed and negative except as noted per HPI  Allergies  Allergen Reactions  . Amoxicillin Rash  . Promethazine Other (See Comments)    Past Medical History:  Diagnosis Date  . 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 Diabetes (HCC) 09/09/2017  . Dyslipidemia 07/22/2014  . GERD (gastroesophageal reflux disease)     Past Surgical History:  Procedure Laterality Date  . KIDNEY DONATION Right     Social History   Socioeconomic History  . Marital status: Married    Spouse name: Not on file  . Number of children: 3  . Years of education: Not on file  . Highest education level: Not on file  Occupational History  . Not on file  Tobacco Use  . Smoking status: Never Smoker  . Smokeless tobacco: Never Used  Vaping Use  . Vaping Use: Never used  Substance and Sexual Activity  . Alcohol use: Yes    Alcohol/week: 0.0 standard drinks  . Drug use: Yes  . Sexual activity: Yes    Partners: Female  Other Topics Concern  . Not on file  Social History Narrative  . Not on file   Social Determinants of Health   Financial Resource Strain: Not on file  Food Insecurity: Not on file  Transportation Needs: Not on file  Physical Activity: Not on file  Stress: Not on  file  Social Connections: Not on file    Family History  Problem Relation Age of Onset  . Diabetes Mother   . Heart disease Father   . Hypertension Maternal Uncle   . Hyperlipidemia Paternal Uncle     Health Maintenance  Topic Date Due  . Hepatitis C Screening  Never done  . PNEUMOCOCCAL POLYSACCHARIDE VACCINE AGE 4-64 HIGH RISK  Never done  . FOOT EXAM  Never done  . OPHTHALMOLOGY EXAM  Never done  . INFLUENZA VACCINE  10/24/2020  . URINE MICROALBUMIN  01/10/2021  . HEMOGLOBIN A1C  01/13/2021  . TETANUS/TDAP  05/26/2026  . COLONOSCOPY (Pts 45-39yrs Insurance coverage will need to be confirmed)  05/31/2027  . COVID-19 Vaccine  Completed  . HIV Screening  Completed  . HPV VACCINES  Aged Out     ----------------------------------------------------------------------------------------------------------------------------------------------------------------------------------------------------------------- Physical Exam BP (!) 143/74 (BP Location: Left Arm, Patient Position: Sitting, Cuff Size: Normal)   Pulse 72   Temp 98 F (36.7 C)   Ht 5\' 3"  (1.6 m)   Wt 161 lb 9.6 oz (73.3 kg)   SpO2 99%   BMI 28.63 kg/m   Physical Exam Constitutional:      Appearance: Normal appearance.  Eyes:     General: No scleral icterus. Cardiovascular:     Rate  and Rhythm: Normal rate and regular rhythm.  Pulmonary:     Effort: Pulmonary effort is normal.     Breath sounds: Normal breath sounds.  Musculoskeletal:     Cervical back: Neck supple.  Neurological:     General: No focal deficit present.     Mental Status: He is alert.  Psychiatric:        Mood and Affect: Mood normal.        Behavior: Behavior normal.     ------------------------------------------------------------------------------------------------------------------------------------------------------------------------------------------------------------------- Assessment and Plan  Type 2 diabetes mellitus without  complications (HCC) Discussed recommendations for dietary and lifestyle change.  He is aware that if a1c continues to trend up I would strongly recommend starting medication to help with control of blood sugars.    Dyslipidemia Continues to tolerate atorvastatin well.  Updated lipid and LFT's at next visit.    No orders of the defined types were placed in this encounter.   Return in about 6 months (around 01/13/2021) for T2DM.    This visit occurred during the SARS-CoV-2 public health emergency.  Safety protocols were in place, including screening questions prior to the visit, additional usage of staff PPE, and extensive cleaning of exam room while observing appropriate contact time as indicated for disinfecting solutions.

## 2020-07-14 NOTE — Assessment & Plan Note (Signed)
Discussed recommendations for dietary and lifestyle change.  He is aware that if a1c continues to trend up I would strongly recommend starting medication to help with control of blood sugars.

## 2020-07-14 NOTE — Patient Instructions (Addendum)
Great to see you today! Continue to work on dietary changes to improve the blood sugars.  See me again in 6 months.  We'll check a full panel of labs at this visit.     Diabetes Mellitus and Nutrition, Adult When you have diabetes, or diabetes mellitus, it is very important to have healthy eating habits because your blood sugar (glucose) levels are greatly affected by what you eat and drink. Eating healthy foods in the right amounts, at about the same times every day, can help you:  Control your blood glucose.  Lower your risk of heart disease.  Improve your blood pressure.  Reach or maintain a healthy weight. What can affect my meal plan? Every person with diabetes is different, and each person has different needs for a meal plan. Your health care provider may recommend that you work with a dietitian to make a meal plan that is best for you. Your meal plan may vary depending on factors such as:  The calories you need.  The medicines you take.  Your weight.  Your blood glucose, blood pressure, and cholesterol levels.  Your activity level.  Other health conditions you have, such as heart or kidney disease. How do carbohydrates affect me? Carbohydrates, also called carbs, affect your blood glucose level more than any other type of food. Eating carbs naturally raises the amount of glucose in your blood. Carb counting is a method for keeping track of how many carbs you eat. Counting carbs is important to keep your blood glucose at a healthy level, especially if you use insulin or take certain oral diabetes medicines. It is important to know how many carbs you can safely have in each meal. This is different for every person. Your dietitian can help you calculate how many carbs you should have at each meal and for each snack. How does alcohol affect me? Alcohol can cause a sudden decrease in blood glucose (hypoglycemia), especially if you use insulin or take certain oral diabetes  medicines. Hypoglycemia can be a life-threatening condition. Symptoms of hypoglycemia, such as sleepiness, dizziness, and confusion, are similar to symptoms of having too much alcohol.  Do not drink alcohol if: ? Your health care provider tells you not to drink. ? You are pregnant, may be pregnant, or are planning to become pregnant.  If you drink alcohol: ? Do not drink on an empty stomach. ? Limit how much you use to:  0-1 drink a day for women.  0-2 drinks a day for men. ? Be aware of how much alcohol is in your drink. In the [place] one drink equals one 12 oz bottle of beer (355 mL), one 5 oz glass of wine (148 mL), or one 1 oz glass of hard liquor (44 mL). ? Keep yourself hydrated with water, diet soda, or unsweetened iced tea.  Keep in mind that regular soda, juice, and other mixers may contain a lot of sugar and must be counted as carbs. What are tips for following this plan? Reading food labels  Start by checking the serving size on the "Nutrition Facts" label of packaged foods and drinks. The amount of calories, carbs, fats, and other nutrients listed on the label is based on one serving of the item. Many items contain more than one serving per package.  Check the total grams (g) of carbs in one serving. You can calculate the number of servings of carbs in one serving by dividing the total carbs by 15. For example, if a  food has 30 g of total carbs per serving, it would be equal to 2 servings of carbs.  Check the number of grams (g) of saturated fats and trans fats in one serving. Choose foods that have a low amount or none of these fats.  Check the number of milligrams (mg) of salt (sodium) in one serving. Most people should limit total sodium intake to less than 2,300 mg per day.  Always check the nutrition information of foods labeled as "low-fat" or "nonfat." These foods may be higher in added sugar or refined carbs and should be avoided.  Talk to your dietitian to identify  your daily goals for nutrients listed on the label. Shopping  Avoid buying canned, pre-made, or processed foods. These foods tend to be high in fat, sodium, and added sugar.  Shop around the outside edge of the grocery store. This is where you will most often find fresh fruits and vegetables, bulk grains, fresh meats, and fresh dairy. Cooking  Use low-heat cooking methods, such as baking, instead of high-heat cooking methods like deep frying.  Cook using healthy oils, such as olive, canola, or sunflower oil.  Avoid cooking with butter, cream, or high-fat meats. Meal planning  Eat meals and snacks regularly, preferably at the same times every day. Avoid going long periods of time without eating.  Eat foods that are high in fiber, such as fresh fruits, vegetables, beans, and whole grains. Talk with your dietitian about how many servings of carbs you can eat at each meal.  Eat 4-6 oz (112-168 g) of lean protein each day, such as lean meat, chicken, fish, eggs, or tofu. One ounce (oz) of lean protein is equal to: ? 1 oz (28 g) of meat, chicken, or fish. ? 1 egg. ?  cup (62 g) of tofu.  Eat some foods each day that contain healthy fats, such as avocado, nuts, seeds, and fish.   What foods should I eat? Fruits Berries. Apples. Oranges. Peaches. Apricots. Plums. Grapes. Mango. Papaya. Pomegranate. Kiwi. Cherries. Vegetables Lettuce. Spinach. Leafy greens, including kale, chard, collard greens, and mustard greens. Beets. Cauliflower. Cabbage. Broccoli. Carrots. Green beans. Tomatoes. Peppers. Onions. Cucumbers. Brussels sprouts. Grains Whole grains, such as whole-wheat or whole-grain bread, crackers, tortillas, cereal, and pasta. Unsweetened oatmeal. Quinoa. Brown or wild rice. Meats and other proteins Seafood. Poultry without skin. Lean cuts of poultry and beef. Tofu. Nuts. Seeds. Dairy Low-fat or fat-free dairy products such as milk, yogurt, and cheese. The items listed above may not  be a complete list of foods and beverages you can eat. Contact a dietitian for more information. What foods should I avoid? Fruits Fruits canned with syrup. Vegetables Canned vegetables. Frozen vegetables with butter or cream sauce. Grains Refined white flour and flour products such as bread, pasta, snack foods, and cereals. Avoid all processed foods. Meats and other proteins Fatty cuts of meat. Poultry with skin. Breaded or fried meats. Processed meat. Avoid saturated fats. Dairy Full-fat yogurt, cheese, or milk. Beverages Sweetened drinks, such as soda or iced tea. The items listed above may not be a complete list of foods and beverages you should avoid. Contact a dietitian for more information. Questions to ask a health care provider  Do I need to meet with a diabetes educator?  Do I need to meet with a dietitian?  What number can I call if I have questions?  When are the best times to check my blood glucose? Where to find more information:  American Diabetes  Association: diabetes.org  Academy of Nutrition and Dietetics: www.eatright.[employer] of Diabetes and Digestive and Kidney Diseases: CarFlippers.tn  Association of Diabetes Care and Education Specialists: www.diabeteseducator.org Summary  It is important to have healthy eating habits because your blood sugar (glucose) levels are greatly affected by what you eat and drink.  A healthy meal plan will help you control your blood glucose and maintain a healthy lifestyle.  Your health care provider may recommend that you work with a dietitian to make a meal plan that is best for you.  Keep in mind that carbohydrates (carbs) and alcohol have immediate effects on your blood glucose levels. It is important to count carbs and to use alcohol carefully. This information is not intended to replace advice given to you by your health care provider. Make sure you discuss any questions you have with your health care  provider. Document Revised: 02/17/2019 Document Reviewed: 02/17/2019 Elsevier Patient Education  2021 [employer].

## 2021-01-09 ENCOUNTER — Encounter: Payer: Self-pay | Admitting: Family Medicine

## 2021-01-09 DIAGNOSIS — Z0000 Encounter for general adult medical examination without abnormal findings: Secondary | ICD-10-CM

## 2021-01-09 DIAGNOSIS — E1129 Type 2 diabetes mellitus with other diabetic kidney complication: Secondary | ICD-10-CM

## 2021-01-09 DIAGNOSIS — E785 Hyperlipidemia, unspecified: Secondary | ICD-10-CM

## 2021-01-12 LAB — COMPLETE METABOLIC PANEL WITH GFR
AG Ratio: 2 (calc) (ref 1.0–2.5)
ALT: 28 U/L (ref 9–46)
AST: 18 U/L (ref 10–35)
Albumin: 4.4 g/dL (ref 3.6–5.1)
Alkaline phosphatase (APISO): 66 U/L (ref 35–144)
BUN: 22 mg/dL (ref 7–25)
CO2: 30 mmol/L (ref 20–32)
Calcium: 9 mg/dL (ref 8.6–10.3)
Chloride: 103 mmol/L (ref 98–110)
Creat: 0.96 mg/dL (ref 0.70–1.30)
Globulin: 2.2 g/dL (calc) (ref 1.9–3.7)
Glucose, Bld: 143 mg/dL — ABNORMAL HIGH (ref 65–99)
Potassium: 4.3 mmol/L (ref 3.5–5.3)
Sodium: 139 mmol/L (ref 135–146)
Total Bilirubin: 1 mg/dL (ref 0.2–1.2)
Total Protein: 6.6 g/dL (ref 6.1–8.1)
eGFR: 94 mL/min/{1.73_m2} (ref >=60)

## 2021-01-12 LAB — CBC WITH DIFFERENTIAL/PLATELET
Absolute Monocytes: 418 cells/uL (ref 200–950)
Basophils Absolute: 29 cells/uL (ref 0–200)
Basophils Relative: 0.6 %
Eosinophils Absolute: 221 cells/uL (ref 15–500)
Eosinophils Relative: 4.6 %
HCT: 44.5 % (ref 38.5–50.0)
Hemoglobin: 15.1 g/dL (ref 13.2–17.1)
Lymphs Abs: 1339 cells/uL (ref 850–3900)
MCH: 30.8 pg (ref 27.0–33.0)
MCHC: 33.9 g/dL (ref 32.0–36.0)
MCV: 90.6 fL (ref 80.0–100.0)
MPV: 9.8 fL (ref 7.5–12.5)
Monocytes Relative: 8.7 %
Neutro Abs: 2794 cells/uL (ref 1500–7800)
Neutrophils Relative %: 58.2 %
Platelets: 197 10*3/uL (ref 140–400)
RBC: 4.91 10*6/uL (ref 4.20–5.80)
RDW: 12.4 % (ref 11.0–15.0)
Total Lymphocyte: 27.9 %
WBC: 4.8 10*3/uL (ref 3.8–10.8)

## 2021-01-12 LAB — HEMOGLOBIN A1C
Hgb A1c MFr Bld: 7.2 % of total Hgb — ABNORMAL HIGH (ref <5.7)
Mean Plasma Glucose: 160 mg/dL
eAG (mmol/L): 8.9 mmol/L

## 2021-01-12 LAB — MICROALBUMIN / CREATININE URINE RATIO
Creatinine, Urine: 72 mg/dL (ref 20–320)
Microalb, Ur: <0.2 mg/dL

## 2021-01-12 LAB — LIPID PANEL W/REFLEX DIRECT LDL
Cholesterol: 135 mg/dL (ref <200)
HDL: 47 mg/dL (ref >=40)
LDL Cholesterol (Calc): 63 mg/dL (calc)
Non-HDL Cholesterol (Calc): 88 mg/dL (calc) (ref <130)
Total CHOL/HDL Ratio: 2.9 (calc) (ref <5.0)
Triglycerides: 172 mg/dL — ABNORMAL HIGH (ref <150)

## 2021-01-13 ENCOUNTER — Encounter: Payer: Self-pay | Admitting: Family Medicine

## 2021-01-13 ENCOUNTER — Ambulatory Visit (INDEPENDENT_AMBULATORY_CARE_PROVIDER_SITE_OTHER): Payer: BC Managed Care – PPO | Admitting: Family Medicine

## 2021-01-13 ENCOUNTER — Other Ambulatory Visit: Payer: Self-pay

## 2021-01-13 VITALS — BP 125/74 | HR 62 | Temp 97.9°F | Ht 63.0 in | Wt 162.0 lb

## 2021-01-13 DIAGNOSIS — E119 Type 2 diabetes mellitus without complications: Secondary | ICD-10-CM | POA: Diagnosis not present

## 2021-01-13 DIAGNOSIS — E785 Hyperlipidemia, unspecified: Secondary | ICD-10-CM | POA: Diagnosis not present

## 2021-01-13 DIAGNOSIS — Z23 Encounter for immunization: Secondary | ICD-10-CM | POA: Diagnosis not present

## 2021-01-13 NOTE — Patient Instructions (Signed)
Diabetes Mellitus and Nutrition, Adult When you have diabetes, or diabetes mellitus, it is very important to have healthy eating habits because your blood sugar (glucose) levels are greatly affected by what you eat and drink. Eating healthy foods in the right amounts, at about the same times every day, can help you:  Control your blood glucose.  Lower your risk of heart disease.  Improve your blood pressure.  Reach or maintain a healthy weight. What can affect my meal plan? Every person with diabetes is different, and each person has different needs for a meal plan. Your health care provider may recommend that you work with a dietitian to make a meal plan that is best for you. Your meal plan may vary depending on factors such as:  The calories you need.  The medicines you take.  Your weight.  Your blood glucose, blood pressure, and cholesterol levels.  Your activity level.  Other health conditions you have, such as heart or kidney disease. How do carbohydrates affect me? Carbohydrates, also called carbs, affect your blood glucose level more than any other type of food. Eating carbs naturally raises the amount of glucose in your blood. Carb counting is a method for keeping track of how many carbs you eat. Counting carbs is important to keep your blood glucose at a healthy level, especially if you use insulin or take certain oral diabetes medicines. It is important to know how many carbs you can safely have in each meal. This is different for every person. Your dietitian can help you calculate how many carbs you should have at each meal and for each snack. How does alcohol affect me? Alcohol can cause a sudden decrease in blood glucose (hypoglycemia), especially if you use insulin or take certain oral diabetes medicines. Hypoglycemia can be a life-threatening condition. Symptoms of hypoglycemia, such as sleepiness, dizziness, and confusion, are similar to symptoms of having too much  alcohol.  Do not drink alcohol if: ? Your health care provider tells you not to drink. ? You are pregnant, may be pregnant, or are planning to become pregnant.  If you drink alcohol: ? Do not drink on an empty stomach. ? Limit how much you use to:  0-1 drink a day for women.  0-2 drinks a day for men. ? Be aware of how much alcohol is in your drink. In the [place] one drink equals one 12 oz bottle of beer (355 mL), one 5 oz glass of wine (148 mL), or one 1 oz glass of hard liquor (44 mL). ? Keep yourself hydrated with water, diet soda, or unsweetened iced tea.  Keep in mind that regular soda, juice, and other mixers may contain a lot of sugar and must be counted as carbs. What are tips for following this plan? Reading food labels  Start by checking the serving size on the "Nutrition Facts" label of packaged foods and drinks. The amount of calories, carbs, fats, and other nutrients listed on the label is based on one serving of the item. Many items contain more than one serving per package.  Check the total grams (g) of carbs in one serving. You can calculate the number of servings of carbs in one serving by dividing the total carbs by 15. For example, if a food has 30 g of total carbs per serving, it would be equal to 2 servings of carbs.  Check the number of grams (g) of saturated fats and trans fats in one serving. Choose foods that have   a low amount or none of these fats.  Check the number of milligrams (mg) of salt (sodium) in one serving. Most people should limit total sodium intake to less than 2,300 mg per day.  Always check the nutrition information of foods labeled as "low-fat" or "nonfat." These foods may be higher in added sugar or refined carbs and should be avoided.  Talk to your dietitian to identify your daily goals for nutrients listed on the label. Shopping  Avoid buying canned, pre-made, or processed foods. These foods tend to be high in fat, sodium, and added  sugar.  Shop around the outside edge of the grocery store. This is where you will most often find fresh fruits and vegetables, bulk grains, fresh meats, and fresh dairy. Cooking  Use low-heat cooking methods, such as baking, instead of high-heat cooking methods like deep frying.  Cook using healthy oils, such as olive, canola, or sunflower oil.  Avoid cooking with butter, cream, or high-fat meats. Meal planning  Eat meals and snacks regularly, preferably at the same times every day. Avoid going long periods of time without eating.  Eat foods that are high in fiber, such as fresh fruits, vegetables, beans, and whole grains. Talk with your dietitian about how many servings of carbs you can eat at each meal.  Eat 4-6 oz (112-168 g) of lean protein each day, such as lean meat, chicken, fish, eggs, or tofu. One ounce (oz) of lean protein is equal to: ? 1 oz (28 g) of meat, chicken, or fish. ? 1 egg. ?  cup (62 g) of tofu.  Eat some foods each day that contain healthy fats, such as avocado, nuts, seeds, and fish.   What foods should I eat? Fruits Berries. Apples. Oranges. Peaches. Apricots. Plums. Grapes. Mango. Papaya. Pomegranate. Kiwi. Cherries. Vegetables Lettuce. Spinach. Leafy greens, including kale, chard, collard greens, and mustard greens. Beets. Cauliflower. Cabbage. Broccoli. Carrots. Green beans. Tomatoes. Peppers. Onions. Cucumbers. Brussels sprouts. Grains Whole grains, such as whole-wheat or whole-grain bread, crackers, tortillas, cereal, and pasta. Unsweetened oatmeal. Quinoa. Brown or wild rice. Meats and other proteins Seafood. Poultry without skin. Lean cuts of poultry and beef. Tofu. Nuts. Seeds. Dairy Low-fat or fat-free dairy products such as milk, yogurt, and cheese. The items listed above may not be a complete list of foods and beverages you can eat. Contact a dietitian for more information. What foods should I avoid? Fruits Fruits canned with  syrup. Vegetables Canned vegetables. Frozen vegetables with butter or cream sauce. Grains Refined white flour and flour products such as bread, pasta, snack foods, and cereals. Avoid all processed foods. Meats and other proteins Fatty cuts of meat. Poultry with skin. Breaded or fried meats. Processed meat. Avoid saturated fats. Dairy Full-fat yogurt, cheese, or milk. Beverages Sweetened drinks, such as soda or iced tea. The items listed above may not be a complete list of foods and beverages you should avoid. Contact a dietitian for more information. Questions to ask a health care provider  Do I need to meet with a diabetes educator?  Do I need to meet with a dietitian?  What number can I call if I have questions?  When are the best times to check my blood glucose? Where to find more information:  American Diabetes Association: diabetes.org  Academy of Nutrition and Dietetics: www.eatright.org  National Institute of Diabetes and Digestive and Kidney Diseases: www.niddk.nih.gov  Association of Diabetes Care and Education Specialists: www.diabeteseducator.org Summary  It is important to have healthy eating   habits because your blood sugar (glucose) levels are greatly affected by what you eat and drink.  A healthy meal plan will help you control your blood glucose and maintain a healthy lifestyle.  Your health care provider may recommend that you work with a dietitian to make a meal plan that is best for you.  Keep in mind that carbohydrates (carbs) and alcohol have immediate effects on your blood glucose levels. It is important to count carbs and to use alcohol carefully. This information is not intended to replace advice given to you by your health care provider. Make sure you discuss any questions you have with your health care provider. Document Revised: 02/17/2019 Document Reviewed: 02/17/2019 Elsevier Patient Education  2021 Elsevier Inc.  

## 2021-01-15 NOTE — Assessment & Plan Note (Signed)
Lab Results  Component Value Date   LDLCALC 63 01/11/2021  Cholesterol is at goal.  He will continue atorvastatin current strength.

## 2021-01-15 NOTE — Progress Notes (Signed)
Darius Roberts - 55 y.o. male MRN [id number]  Date of birth: 1966-01-10  Subjective Chief Complaint  Patient presents with   Diabetes    HPI Darius Roberts is a 55 year old male here today for follow-up visit.  Reports that he feels well.   He has been resistant to starting pharmacotherapy for management of his diabetes.  His A1c has improved some since last check.  He continues on berberine in addition to working on dietary and exercise changes.  He would be interested in seeing a nutritionist as well.  He is doing well with atorvastatin for management of hyperlipidemia.  He has not noted any side effects with this.  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)    Diabetes (HCC) 09/09/2017   Dyslipidemia 07/22/2014   GERD (gastroesophageal reflux disease)     Past Surgical History:  Procedure Laterality Date   KIDNEY DONATION Right     Social History   Socioeconomic History   Marital status: Married    Spouse name: Not on file   Number of children: 3   Years of education: Not on file   Highest education level: Not on file  Occupational History   Not on file  Tobacco Use   Smoking status: Never   Smokeless tobacco: Never  Vaping Use   Vaping Use: Never used  Substance and Sexual Activity   Alcohol use: Yes    Alcohol/week: 0.0 standard drinks   Drug use: Yes   Sexual activity: Yes    Partners: Female  Other Topics Concern   Not on file  Social History Narrative   Not on file   Social Determinants of Health   Financial Resource Strain: Not on file  Food Insecurity: Not on file  Transportation Needs: Not on file  Physical Activity: Not on file  Stress: Not on file  Social Connections: Not on file    Family History  Problem  Relation Age of Onset   Diabetes Mother    Heart disease Father    Hypertension Maternal Uncle    Hyperlipidemia Paternal Uncle     Health Maintenance  Topic Date Due   Pneumococcal Vaccine 54-30 Years old (1 - PCV) Never done   OPHTHALMOLOGY EXAM  Never done   Hepatitis C Screening  Never done   COVID-19 Vaccine (4 - Booster for Pfizer series) 06/03/2020   HEMOGLOBIN A1C  07/12/2021   URINE MICROALBUMIN  01/11/2022   FOOT EXAM  01/13/2022   TETANUS/TDAP  05/26/2026   COLONOSCOPY (Pts 45-54yrs Insurance coverage will need to be confirmed)  05/31/2027   INFLUENZA VACCINE  Completed   HIV Screening  Completed   Zoster Vaccines- Shingrix  Completed   HPV VACCINES  Aged Out     ----------------------------------------------------------------------------------------------------------------------------------------------------------------------------------------------------------------- Physical Exam BP 125/74 (BP Location: Left Arm, Patient Position: Sitting, Cuff Size: Normal)   Pulse 62   Temp 97.9 F (36.6 C)   Ht 5\' 3"  (1.6 m)   Wt 162 lb (73.5 kg)   SpO2 99%   BMI 28.70 kg/m   Physical Exam Constitutional:      Appearance: Normal appearance.  Eyes:     General: No scleral icterus. Musculoskeletal:     Cervical back: Neck supple.  Neurological:  Mental Status: He is alert.  Psychiatric:        Mood and Affect: Mood normal.        Behavior: Behavior normal.    ------------------------------------------------------------------------------------------------------------------------------------------------------------------------------------------------------------------- Assessment and Plan  Dyslipidemia Lab Results  Component Value Date   LDLCALC 63 01/11/2021  Cholesterol is at goal.  He will continue atorvastatin current strength.  Type 2 diabetes mellitus without complications (HCC) Slight improvement with A1c.  He will continue to work on diet and  exercise changes.  Referral placed to nutritionist.  We discussed that if blood sugars remain elevated we should consider adding additional pharmacotherapy for management of glucose.   No orders of the defined types were placed in this encounter.   Return in about 3 months (around 04/15/2021) for T2DM.    This visit occurred during the SARS-CoV-2 public health emergency.  Safety protocols were in place, including screening questions prior to the visit, additional usage of staff PPE, and extensive cleaning of exam room while observing appropriate contact time as indicated for disinfecting solutions.

## 2021-01-15 NOTE — Assessment & Plan Note (Signed)
Slight improvement with A1c.  He will continue to work on diet and exercise changes.  Referral placed to nutritionist.  We discussed that if blood sugars remain elevated we should consider adding additional pharmacotherapy for management of glucose.

## 2021-01-26 ENCOUNTER — Other Ambulatory Visit: Payer: Self-pay | Admitting: Family Medicine

## 2021-01-26 DIAGNOSIS — E785 Hyperlipidemia, unspecified: Secondary | ICD-10-CM

## 2021-02-27 ENCOUNTER — Encounter: Payer: Self-pay | Admitting: Family Medicine

## 2021-02-27 NOTE — Telephone Encounter (Signed)
Form completed and placed in Darius Roberts's box

## 2021-03-01 ENCOUNTER — Ambulatory Visit: Payer: BC Managed Care – PPO | Admitting: Dietician

## 2021-03-06 ENCOUNTER — Ambulatory Visit: Payer: BC Managed Care – PPO | Admitting: Registered"

## 2021-04-07 ENCOUNTER — Encounter: Payer: Self-pay | Admitting: Family Medicine

## 2021-04-17 ENCOUNTER — Encounter: Payer: Self-pay | Admitting: Family Medicine

## 2021-04-17 ENCOUNTER — Ambulatory Visit: Payer: 59 | Admitting: Family Medicine

## 2021-04-17 ENCOUNTER — Other Ambulatory Visit: Payer: Self-pay

## 2021-04-17 VITALS — BP 132/81 | HR 58 | Temp 97.3°F | Ht 63.0 in | Wt 151.0 lb

## 2021-04-17 DIAGNOSIS — E119 Type 2 diabetes mellitus without complications: Secondary | ICD-10-CM

## 2021-04-17 DIAGNOSIS — E785 Hyperlipidemia, unspecified: Secondary | ICD-10-CM

## 2021-04-17 LAB — POCT GLYCOSYLATED HEMOGLOBIN (HGB A1C): HbA1c, POC (controlled diabetic range): 6.5 % (ref 0.0–7.0)

## 2021-04-17 NOTE — Patient Instructions (Signed)
Great job with weight and blood sugar control.  Continue to work on dietary changes.   See me again in 3-4 months for a physical.

## 2021-04-17 NOTE — Progress Notes (Signed)
Dmarius Roberts - 56 y.o. male MRN [id number]  Date of birth: 1966-02-17  Subjective Chief Complaint  Patient presents with   Diabetes    HPI Darius Roberts is a 56 year old male here today for follow-up visit.  He has history of type 2 diabetes and hyperlipidemia.  He has been working on dietary and lifestyle change over the past several months for better management of his chronic medical conditions.  He is working with a Control and instrumentation engineer through his Clinical research associate.  He is following my plate method to help with portion control.  His weight is down approximately 11 pounds of last visit.  His A1c has improved from 7.2 to 6.5% today.  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)    Diabetes (HCC) 09/09/2017   Dyslipidemia 07/22/2014   GERD (gastroesophageal reflux disease)     Past Surgical History:  Procedure Laterality Date   KIDNEY DONATION Right     Social History   Socioeconomic History   Marital status: Married    Spouse name: Not on file   Number of children: 3   Years of education: Not on file   Highest education level: Not on file  Occupational History   Not on file  Tobacco Use   Smoking status: Never   Smokeless tobacco: Never  Vaping Use   Vaping Use: Never used  Substance and Sexual Activity   Alcohol use: Yes    Alcohol/week: 0.0 standard drinks   Drug use: Yes   Sexual activity: Yes    Partners: Female  Other Topics Concern   Not on file  Social History Narrative   Not on file   Social Determinants of Health   Financial Resource Strain: Not on file  Food Insecurity: Not on file  Transportation Needs: Not on file  Physical Activity: Not on file  Stress: Not on file  Social Connections: Not on file    Family History  Problem  Relation Age of Onset   Diabetes Mother    Heart disease Father    Hypertension Maternal Uncle    Hyperlipidemia Paternal Uncle     Health Maintenance  Topic Date Due   OPHTHALMOLOGY EXAM  Never done   Hepatitis C Screening  Never done   COVID-19 Vaccine (4 - Booster for Pfizer series) 06/03/2020   HEMOGLOBIN A1C  10/15/2021   URINE MICROALBUMIN  01/11/2022   FOOT EXAM  01/13/2022   TETANUS/TDAP  05/26/2026   COLONOSCOPY (Pts 45-53yrs Insurance coverage will need to be confirmed)  05/31/2027   INFLUENZA VACCINE  Completed   HIV Screening  Completed   Zoster Vaccines- Shingrix  Completed   HPV VACCINES  Aged Out     ----------------------------------------------------------------------------------------------------------------------------------------------------------------------------------------------------------------- Physical Exam BP 132/81 (BP Location: Left Arm, Patient Position: Sitting, Cuff Size: Normal)    Pulse (!) 58    Temp (!) 97.3 F (36.3 C)    Ht 5\' 3"  (1.6 m)    Wt 151 lb (68.5 kg)    SpO2 100%    BMI 26.75 kg/m   Physical Exam Constitutional:      Appearance: Normal appearance.  Eyes:     General: No scleral icterus. Cardiovascular:     Rate and Rhythm: Normal rate and regular rhythm.  Pulmonary:  Effort: Pulmonary effort is normal.     Breath sounds: Normal breath sounds.  Musculoskeletal:     Cervical back: Neck supple.  Neurological:     General: No focal deficit present.     Mental Status: He is alert.  Psychiatric:        Mood and Affect: Mood normal.        Behavior: Behavior normal.    ------------------------------------------------------------------------------------------------------------------------------------------------------------------------------------------------------------------- Assessment and Plan  Type 2 diabetes mellitus without complications (HCC) Blood sugars have improved significantly with changes to diet and  exercise.  Encouraged to continue to keep this up for further improvement of his blood sugars and weight loss.  Dyslipidemia Continues to tolerate atorvastatin well.  Continue current strength.   No orders of the defined types were placed in this encounter.   Return in about 4 months (around 08/15/2021) for Physical.    This visit occurred during the SARS-CoV-2 public health emergency.  Safety protocols were in place, including screening questions prior to the visit, additional usage of staff PPE, and extensive cleaning of exam room while observing appropriate contact time as indicated for disinfecting solutions.

## 2021-04-17 NOTE — Assessment & Plan Note (Signed)
Blood sugars have improved significantly with changes to diet and exercise.  Encouraged to continue to keep this up for further improvement of his blood sugars and weight loss.

## 2021-04-17 NOTE — Assessment & Plan Note (Signed)
Continues to tolerate atorvastatin well.  Continue current strength. °

## 2021-04-29 ENCOUNTER — Encounter: Payer: Self-pay | Admitting: Family Medicine

## 2021-07-06 ENCOUNTER — Encounter: Payer: Self-pay | Admitting: Family Medicine

## 2021-07-06 DIAGNOSIS — G8929 Other chronic pain: Secondary | ICD-10-CM

## 2021-07-06 NOTE — Telephone Encounter (Signed)
Pended referral to [facility]. Please advise.  ?

## 2021-07-17 ENCOUNTER — Ambulatory Visit: Payer: 59 | Admitting: Family Medicine

## 2021-07-17 VITALS — BP 110/60 | Ht 64.0 in | Wt 146.0 lb

## 2021-07-17 DIAGNOSIS — M7502 Adhesive capsulitis of left shoulder: Secondary | ICD-10-CM

## 2021-07-17 DIAGNOSIS — S43439A Superior glenoid labrum lesion of unspecified shoulder, initial encounter: Secondary | ICD-10-CM | POA: Insufficient documentation

## 2021-07-17 NOTE — Patient Instructions (Signed)
Nice to meet you ?We'll get the MRI at [facility] imaging   ?Please send me a message in MyChart with any questions or updates.  ?We'll setup a virtual visit once the MRI is resulted.  ? ?--Dr. Jordan Likes ? ?

## 2021-07-17 NOTE — Progress Notes (Signed)
?  Darius Roberts - 56 y.o. male MRN [id number]  Date of birth: 1965/09/16 ? ?SUBJECTIVE:  Including CC & ROS.  ?No chief complaint on file. ? ? ?Darius Roberts is a 56 y.o. male that is presenting with acute on chronic left shoulder pain.  The pain has been ongoing for a few years.  He reports the pain after a fall that he had working at [employer].  He has tried injection and physical therapy.  He still has limited abduction and flexion.  No history of surgery.. ? ?Independent review of the left shoulder x-ray from 2022 shows no acute changes. ? ? ?Review of Systems ?See HPI  ? ?HISTORY: Past Medical, Surgical, Social, and Family History Reviewed & Updated per EMR.   ?Pertinent Historical Findings include: ? ?Past Medical History:  ?Diagnosis Date  ? Absence of kidney 07/22/2014  ? 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 Diabetes (HCC) 09/09/2017  ? Dyslipidemia 07/22/2014  ? GERD (gastroesophageal reflux disease)   ? ? ?Past Surgical History:  ?Procedure Laterality Date  ? KIDNEY DONATION Right   ? ? ? ?PHYSICAL EXAM:  ?VS: BP 110/60   Ht 5\' 4"  (1.626 m)   Wt 146 lb (66.2 kg)   BMI 25.06 kg/m?  ?Physical Exam ?Gen: NAD, alert, cooperative with exam, well-appearing ?MSK:  ?Left shoulder: ?Limited flexion. ?Limited external rotation. ?Positive O'Brien's test. ?Weakness with resistance to crossarm testing ?Neurovascularly intact   ? ? ? ? ?ASSESSMENT & PLAN:  ? ?Adhesive capsulitis of left shoulder ?Acute on chronic in nature.  Reports his symptoms initiated after a fall that he had at work from a few years ago.  Still having significant limited rotation today.  Previous x-rays have been normal.  Has gone through over 6 months of physical therapy. ?-Counseled on home exercise therapy and supportive care. ?-MRI of the left shoulder to evaluate for labral tear, capsulitis and for presurgical planning. ? ? ? ? ?

## 2021-07-17 NOTE — Assessment & Plan Note (Signed)
Acute on chronic in nature.  Reports his symptoms initiated after a fall that he had at work from a few years ago.  Still having significant limited rotation today.  Previous x-rays have been normal.  Has gone through over 6 months of physical therapy. ?-Counseled on home exercise therapy and supportive care. ?-MRI of the left shoulder to evaluate for labral tear, capsulitis and for presurgical planning. ?

## 2021-08-11 ENCOUNTER — Other Ambulatory Visit: Payer: Self-pay | Admitting: Family Medicine

## 2021-08-11 DIAGNOSIS — Z0000 Encounter for general adult medical examination without abnormal findings: Secondary | ICD-10-CM

## 2021-08-11 DIAGNOSIS — E119 Type 2 diabetes mellitus without complications: Secondary | ICD-10-CM

## 2021-08-11 DIAGNOSIS — E559 Vitamin D deficiency, unspecified: Secondary | ICD-10-CM

## 2021-08-11 DIAGNOSIS — E785 Hyperlipidemia, unspecified: Secondary | ICD-10-CM

## 2021-08-12 ENCOUNTER — Ambulatory Visit
Admission: RE | Admit: 2021-08-12 | Discharge: 2021-08-12 | Disposition: A | Discharge status: Home / Self Care | Payer: 59 | Source: Ambulatory Visit | Attending: Family Medicine | Admitting: Family Medicine

## 2021-08-12 DIAGNOSIS — M7502 Adhesive capsulitis of left shoulder: Secondary | ICD-10-CM

## 2021-08-12 LAB — CBC WITH DIFFERENTIAL/PLATELET
Absolute Monocytes: 436 cells/uL (ref 200–950)
Basophils Absolute: 49 cells/uL (ref 0–200)
Basophils Relative: 1 %
Eosinophils Absolute: 299 cells/uL (ref 15–500)
Eosinophils Relative: 6.1 %
HCT: 46.7 % (ref 38.5–50.0)
Hemoglobin: 16.1 g/dL (ref 13.2–17.1)
Lymphs Abs: 1210 cells/uL (ref 850–3900)
MCH: 31.6 pg (ref 27.0–33.0)
MCHC: 34.5 g/dL (ref 32.0–36.0)
MCV: 91.6 fL (ref 80.0–100.0)
MPV: 10.1 fL (ref 7.5–12.5)
Monocytes Relative: 8.9 %
Neutro Abs: 2906 cells/uL (ref 1500–7800)
Neutrophils Relative %: 59.3 %
Platelets: 202 10*3/uL (ref 140–400)
RBC: 5.1 10*6/uL (ref 4.20–5.80)
RDW: 12.2 % (ref 11.0–15.0)
Total Lymphocyte: 24.7 %
WBC: 4.9 10*3/uL (ref 3.8–10.8)

## 2021-08-12 LAB — COMPLETE METABOLIC PANEL WITH GFR
AG Ratio: 1.9 (calc) (ref 1.0–2.5)
ALT: 39 U/L (ref 9–46)
AST: 17 U/L (ref 10–35)
Albumin: 4.6 g/dL (ref 3.6–5.1)
Alkaline phosphatase (APISO): 68 U/L (ref 35–144)
BUN: 19 mg/dL (ref 7–25)
CO2: 27 mmol/L (ref 20–32)
Calcium: 9.8 mg/dL (ref 8.6–10.3)
Chloride: 103 mmol/L (ref 98–110)
Creat: 1.04 mg/dL (ref 0.70–1.30)
Globulin: 2.4 g/dL (calc) (ref 1.9–3.7)
Glucose, Bld: 149 mg/dL — ABNORMAL HIGH (ref 65–99)
Potassium: 4.8 mmol/L (ref 3.5–5.3)
Sodium: 140 mmol/L (ref 135–146)
Total Bilirubin: 1 mg/dL (ref 0.2–1.2)
Total Protein: 7 g/dL (ref 6.1–8.1)
eGFR: 85 mL/min/{1.73_m2} (ref >=60)

## 2021-08-12 LAB — LIPID PANEL W/REFLEX DIRECT LDL
Cholesterol: 160 mg/dL (ref <200)
HDL: 49 mg/dL (ref >=40)
LDL Cholesterol (Calc): 84 mg/dL (calc)
Non-HDL Cholesterol (Calc): 111 mg/dL (calc) (ref <130)
Total CHOL/HDL Ratio: 3.3 (calc) (ref <5.0)
Triglycerides: 178 mg/dL — ABNORMAL HIGH (ref <150)

## 2021-08-12 LAB — VITAMIN D 25 HYDROXY (VIT D DEFICIENCY, FRACTURES): Vit D, 25-Hydroxy: 30 ng/mL (ref 30–100)

## 2021-08-12 LAB — HEMOGLOBIN A1C
Hgb A1c MFr Bld: 6.9 % of total Hgb — ABNORMAL HIGH (ref <5.7)
Mean Plasma Glucose: 151 mg/dL
eAG (mmol/L): 8.4 mmol/L

## 2021-08-12 LAB — TSH: TSH: 3.64 mIU/L (ref 0.40–4.50)

## 2021-08-14 ENCOUNTER — Encounter: Payer: Self-pay | Admitting: Family Medicine

## 2021-08-14 ENCOUNTER — Telehealth (INDEPENDENT_AMBULATORY_CARE_PROVIDER_SITE_OTHER): Payer: 59 | Admitting: Family Medicine

## 2021-08-14 ENCOUNTER — Telehealth: Payer: Self-pay | Admitting: Family Medicine

## 2021-08-14 DIAGNOSIS — S43432D Superior glenoid labrum lesion of left shoulder, subsequent encounter: Secondary | ICD-10-CM

## 2021-08-14 NOTE — Telephone Encounter (Signed)
Called pt to scheduled MRI virtual appt--No answer--msg left.--glh

## 2021-08-14 NOTE — Assessment & Plan Note (Signed)
Acute on chronic in nature.  Has completed conservative therapy with little improvement. -Counseled on home exercise therapy and supportive care. -Referral to orthopedic surgery.

## 2021-08-14 NOTE — Telephone Encounter (Signed)
Form printed and placed in Dr. Ashley Royalty basket along with charge slip.  Tiajuana Amass, CMA

## 2021-08-14 NOTE — Progress Notes (Signed)
Virtual Visit via Video Note  I connected with Darius Roberts on 08/14/21 at  2:10 PM EDT by a video enabled telemedicine application and verified that I am speaking with the correct person using two identifiers.  Location: Patient: home Provider: office   I discussed the limitations of evaluation and management by telemedicine and the availability of in person appointments. The patient expressed understanding and agreed to proceed.  History of Present Illness:  Mr. Darius Roberts is a 56 year old male that is following up after the MRI of his left shoulder.  The MRI was revealing for a superior labral tear extending into the posterior labrum.  This initial injury was 3 years ago.  He has been through physical therapy and tried injections in the shoulder in the past.  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  Has completed conservative therapy with little improvement. -Counseled on home exercise therapy and supportive care. -Referral to orthopedic surgery.  Follow Up Instructions:    I discussed the assessment and treatment plan with the patient. The patient was provided an opportunity to ask questions and all were answered. The patient agreed with the plan and demonstrated an understanding of the instructions.   The patient was advised to call back or seek an in-person evaluation if the symptoms worsen or if the condition fails to improve as anticipated.   Clearance Coots, MD

## 2021-08-15 NOTE — Addendum Note (Signed)
Addended by: Myra Rude on: 08/15/2021 08:15 AM   Modules accepted: Orders

## 2021-08-16 ENCOUNTER — Ambulatory Visit (INDEPENDENT_AMBULATORY_CARE_PROVIDER_SITE_OTHER): Payer: 59 | Admitting: Family Medicine

## 2021-08-16 ENCOUNTER — Encounter: Payer: Self-pay | Admitting: Family Medicine

## 2021-08-16 VITALS — BP 128/82 | HR 77 | Ht 64.0 in | Wt 153.0 lb

## 2021-08-16 DIAGNOSIS — Z0000 Encounter for general adult medical examination without abnormal findings: Secondary | ICD-10-CM

## 2021-08-16 NOTE — Progress Notes (Signed)
Darius Roberts - 56 y.o. male MRN [id number]  Date of birth: 03/01/1966  Subjective Chief Complaint  Patient presents with   Diabetes    HPI Darius Roberts is a 56 y.o. male here today for annual exam.  Reports that he is doing well.  He had labs completed prior to visit which we reviewed today.  Blood sugars are a little elevated.  He has restarted berberine,  had good results with this previously.   He is trying to stay pretty active but has been limited by a shoulder injury.  Recent MRI revealed labral tear.  He has surgical consult this afternoon.   He is working on dietary changes.   He is a non-smoker, he rarely consumes EtOH   Review of Systems  Constitutional:  Negative for chills, fever, malaise/fatigue and weight loss.  HENT:  Negative for congestion, ear pain and sore throat.   Eyes:  Negative for blurred vision, double vision and pain.  Respiratory:  Negative for cough and shortness of breath.   Cardiovascular:  Negative for chest pain and palpitations.  Gastrointestinal:  Negative for abdominal pain, blood in stool, constipation, heartburn and nausea.  Genitourinary:  Negative for dysuria and urgency.  Musculoskeletal:  Negative for joint pain and myalgias.  Neurological:  Negative for dizziness and headaches.  Endo/Heme/Allergies:  Does not bruise/bleed easily.  Psychiatric/Behavioral:  Negative for depression. The patient is not nervous/anxious and does not have insomnia.     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)    Diabetes (HCC) 09/09/2017   Dyslipidemia 07/22/2014   GERD (gastroesophageal reflux disease)     Past Surgical History:  Procedure Laterality Date   KIDNEY DONATION Right     Social History   Socioeconomic History   Marital status: Married    Spouse name: Not on file   Number of  children: 3   Years of education: Not on file   Highest education level: Not on file  Occupational History   Not on file  Tobacco Use   Smoking status: Never   Smokeless tobacco: Never  Vaping Use   Vaping Use: Never used  Substance and Sexual Activity   Alcohol use: Yes    Alcohol/week: 0.0 standard drinks   Drug use: Yes   Sexual activity: Yes    Partners: Female  Other Topics Concern   Not on file  Social History Narrative   Not on file   Social Determinants of Health   Financial Resource Strain: Not on file  Food Insecurity: Not on file  Transportation Needs: Not on file  Physical Activity: Not on file  Stress: Not on file  Social Connections: Not on file    Family History  Problem Relation Age of Onset   Diabetes Mother    Heart disease Father    Hypertension Maternal Uncle    Hyperlipidemia Paternal Uncle     Health Maintenance  Topic Date Due   OPHTHALMOLOGY EXAM  11/24/2021 (Originally 12/18/1976)   COVID-19 Vaccine (4 - Booster for Pfizer series) 11/24/2021 (Originally 06/03/2020)   Hepatitis C Screening  08/17/2022 (Originally 12/18/1984)   INFLUENZA VACCINE  10/24/2021   URINE MICROALBUMIN  01/11/2022   FOOT EXAM  01/13/2022   HEMOGLOBIN A1C  02/11/2022   TETANUS/TDAP  05/26/2026   COLONOSCOPY (Pts 45-34yrs Insurance coverage will need  to be confirmed)  05/31/2027   HIV Screening  Completed   Zoster Vaccines- Shingrix  Completed   HPV VACCINES  Aged Out     ----------------------------------------------------------------------------------------------------------------------------------------------------------------------------------------------------------------- Physical Exam BP 128/82 (BP Location: Right Arm, Patient Position: Sitting, Cuff Size: Normal)   Pulse 77   Ht 5\' 4"  (1.626 m)   Wt 153 lb (69.4 kg)   SpO2 100%   BMI 26.26 kg/m   Physical Exam Constitutional:      General: He is not in acute distress. HENT:     Head:  Normocephalic and atraumatic.     Right Ear: Tympanic membrane and external ear normal.     Left Ear: Tympanic membrane and external ear normal.  Eyes:     General: No scleral icterus. Neck:     Thyroid: No thyromegaly.  Cardiovascular:     Rate and Rhythm: Normal rate and regular rhythm.     Heart sounds: Normal heart sounds.  Pulmonary:     Effort: Pulmonary effort is normal.     Breath sounds: Normal breath sounds.  Abdominal:     General: Bowel sounds are normal. There is no distension.     Palpations: Abdomen is soft.     Tenderness: There is no abdominal tenderness. There is no guarding.  Musculoskeletal:     Cervical back: Normal range of motion.  Lymphadenopathy:     Cervical: No cervical adenopathy.  Skin:    General: Skin is warm and dry.     Findings: No rash.  Neurological:     Mental Status: He is alert and oriented to person, place, and time.     Cranial Nerves: No cranial nerve deficit.     Motor: No abnormal muscle tone.  Psychiatric:        Mood and Affect: Mood normal.        Behavior: Behavior normal.    ------------------------------------------------------------------------------------------------------------------------------------------------------------------------------------------------------------------- Assessment and Plan  Well adult exam Well adult Screenings: UTD Immunizations: UTD Anticipatory guidance/Risk factor reduction:  Per AVS.   No orders of the defined types were placed in this encounter.   Return in about 6 months (around 02/16/2022) for Blood sugars f/u.    This visit occurred during the SARS-CoV-2 public health emergency.  Safety protocols were in place, including screening questions prior to the visit, additional usage of staff PPE, and extensive cleaning of exam room while observing appropriate contact time as indicated for disinfecting solutions.

## 2021-08-16 NOTE — Patient Instructions (Signed)

## 2021-08-16 NOTE — Assessment & Plan Note (Signed)
Well adult Screenings: UTD Immunizations: UTD Anticipatory guidance/Risk factor reduction:  Per AVS.

## 2021-08-17 ENCOUNTER — Telehealth: Payer: Self-pay

## 2021-08-17 NOTE — Telephone Encounter (Signed)
Faxed Physician Results form to [facility] per pt request. Confirmation received.

## 2021-08-28 ENCOUNTER — Ambulatory Visit: Payer: 59 | Attending: Orthopedic Surgery | Admitting: Physical Therapy

## 2021-08-28 DIAGNOSIS — M6281 Muscle weakness (generalized): Secondary | ICD-10-CM | POA: Diagnosis present

## 2021-08-28 DIAGNOSIS — G8929 Other chronic pain: Secondary | ICD-10-CM | POA: Insufficient documentation

## 2021-08-28 DIAGNOSIS — M25512 Pain in left shoulder: Secondary | ICD-10-CM | POA: Diagnosis present

## 2021-08-28 DIAGNOSIS — M25612 Stiffness of left shoulder, not elsewhere classified: Secondary | ICD-10-CM | POA: Diagnosis present

## 2021-08-28 NOTE — Patient Instructions (Signed)
Access Code: [email or web address] URL: [email or web address] Date: 08/28/2021 Prepared by: Vernon Prey April Kirstie Peri  Exercises - Shoulder External Rotation and Scapular Retraction with Resistance  - 1 x daily - 7 x weekly - 2 sets - 10 reps - Shoulder W - External Rotation with Resistance  - 1 x daily - 7 x weekly - 2 sets - 10 reps - Standing Bilateral Low Shoulder Row with Anchored Resistance  - 1 x daily - 7 x weekly - 2 sets - 10 reps

## 2021-08-28 NOTE — Therapy (Signed)
[facility] [place] [address] [place], [place], [address] Phone: [phone]   Fax:  [phone]  Physical Therapy Evaluation  Patient Details  Name: Darius Roberts MRN: [id number] Date of Birth: 1965-09-19 Referring Provider (PT): Jones Broom, MD   Encounter Date: 08/28/2021   PT End of Session - 08/28/21 1604     Visit Number 1    Number of Visits 12    Date for PT Re-Evaluation 10/09/21    Authorization Type UHC    PT Start Time 0718    PT Stop Time 0805    PT Time Calculation (min) 47 min    Activity Tolerance Patient tolerated treatment well    Behavior During Therapy [facility] for tasks assessed/performed             Past Medical History:  Diagnosis Date   Absence of kidney 07/22/2014   Overview:  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    Subjective Assessment - 08/28/21 0720     Subjective Pt notes has been trying to get back to physical activity -- has an elliptical. 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 Pt saw PT in the past for 6 months. Pt he got cortisone shot which did provide some relief. Pt notes that more recently shot wore off. Pt was given MRI and was recommended surgery by one doctor. Pt is interested in pursuing possible surgery.    Limitations Lifting    How long can you sit comfortably? n/a    How long can you stand comfortably? n/a    How long can you walk comfortably? n/a    Patient Stated Goals Improve pain and shoulder motion    Currently in Pain? 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    Referring Provider (PT) Jones Broom, MD    Hand Dominance Right    Prior Therapy for L shoulder x 6 months      Precautions   Precautions None      Restrictions   Weight Bearing Restrictions No      Balance Screen   Has the patient fallen in the past 6 months No      Home Environment   Living Environment Private residence    Living Arrangements Spouse/significant other    Available Help at Discharge Family    Type of Home  House      Prior Function   Vocation Full time employment    Vocation Requirements Desk work      Observation/Other Assessments   Focus on Therapeutic Outcomes (FOTO)  n/a      Posture/Postural Control   Posture/Postural Control Postural limitations    Postural Limitations Rounded Shoulders;Increased thoracic kyphosis      ROM / Strength   AROM / PROM / Strength AROM;Strength      AROM   AROM Assessment Site Shoulder    Right/Left Shoulder Left    Left Shoulder Extension 70 Degrees    Left Shoulder Flexion 139 Degrees   feel weakness   Left Shoulder ABduction 128 Degrees   limited by pain   Left Shoulder Internal Rotation --   Thumb to 10; limited by pain   Left Shoulder External Rotation --   middle finger to T3     Strength   Overall Strength Comments weak posterior shoulder girdle    Strength Assessment Site Shoulder    Right/Left Shoulder Left    Left Shoulder Flexion 5/5    Left Shoulder Extension 5/5    Left Shoulder ABduction 4-/5     Left Shoulder Internal Rotation 4/5    Left Shoulder External Rotation 4-/5    Left Shoulder Horizontal ABduction 3+/5    Left Shoulder Horizontal ADduction 5/5      Palpation   Palpation comment Hypomobile with inferior and posterior GH glides; taut and tender lats                        Objective measurements completed on examination: See above findings.                PT Education - 08/28/21 1603     Education Details Exam findings, POC, and initial HEP    Person(s) Educated Patient    Methods Explanation;Demonstration;Tactile cues;Verbal cues;Handout    Comprehension Verbalized understanding;Returned demonstration;Verbal cues required;Tactile cues required;Need further instruction                 PT Long Term Goals - 08/28/21 1612       PT LONG TERM GOAL #1   Title Pt will be independent with HEP    Time 6    Period Weeks    Status New    Target Date 10/09/21      PT LONG TERM GOAL #2   Title Pt will demo R = L shoulder ROM with </=1/10 pain    Time 6    Period Weeks    Status New    Target Date 10/09/21      PT LONG TERM GOAL #3   Title Pt will demo at least 4/5 posterior shoulder strength    Time 6    Period Weeks    Status New    Target Date 10/09/21      PT LONG TERM GOAL #4   Title Pt will be able to lift at least 5# overhead to reach in kitchen cabinets    Time 6    Period Weeks    Status New    Target Date 10/09/21                    Plan - 08/28/21 1605     Clinical Impression Statement Darius Roberts is a 56 y/o M presenting to OPPT for L shoulder pain. MRI confirms superior labram tear extending into the posterior labrum. Tear  first occurred after a fall during work ~2-3 years ago. Assessment significant for forward shoulder with weak posterior shoulder girdle and decreased scapulohumeral rhythm and anteriorly tilted L scapula. Tight lats noted with difficulty performing inferior glide with shoulder  elevation. Pt with limited L shoulder ROM compared to right with increased pain at end range motions. Pt would benefit from PT to maximize his shoulder function with possibility of surgery pending on PT course. Pt has already had 1 course of PT for 6 months.    Personal Factors and Comorbidities Fitness;Time since onset of injury/illness/exacerbation    Examination-Activity Limitations Reach Overhead;Carry;Lift;Hygiene/Grooming    Examination-Participation Restrictions Community Activity;Occupation    Stability/Clinical Decision Making Evolving/Moderate complexity    Clinical Decision Making Moderate    Rehab Potential Good    PT Frequency 2x / week    PT Duration 6 weeks    PT Treatment/Interventions ADLs/Self Care Home Management;Cryotherapy;Electrical Stimulation;Iontophoresis 4mg /ml Dexamethasone;Moist Heat;DME Instruction;Functional mobility training;Therapeutic exercise;Therapeutic activities;Neuromuscular re-education;Manual techniques;Patient/family education;Passive range of motion;Dry needling;Taping    PT Next Visit Plan Assess response to HEP. Stretch pecs and lats. Scapular mobilizations and stability. Work on posterior shoulder girdle strengthening    PT Home Exercise Plan Access Code: [email or web address]    Consulted and Agree with Plan of Care Patient             Patient will benefit from skilled therapeutic intervention in order to improve the following deficits and impairments:  Decreased range of motion, Increased fascial restricitons, Increased muscle spasms, Impaired UE functional use, Decreased activity tolerance, Pain, Hypomobility, Improper body mechanics, Decreased mobility, Decreased strength, Postural dysfunction  Visit Diagnosis: Chronic left shoulder pain  Stiffness of left shoulder, not elsewhere classified  Muscle weakness (generalized)     Problem List Patient Active Problem List   Diagnosis Date Noted   Well adult exam 08/16/2021   Labral tear of shoulder  07/17/2021   Type 2 diabetes mellitus without complications (HCC) 09/09/2017   Achilles tendonitis, bilateral 09/13/2016   Absence of kidney 07/22/2014   Dyslipidemia 07/22/2014   Acid reflux 07/22/2014   Avitaminosis D 07/22/2014    Chelsea Pedretti April Ma L Kenyon [place], PT, DPT 08/28/2021, 4:17 PM  [facility] [address] [place], [place], [place] Phone: [phone]   Fax:  [phone]  Name: Khalen Styer MRN: [name] Date of Birth: [birth year]

## 2021-09-04 ENCOUNTER — Ambulatory Visit: Payer: 59 | Admitting: Physical Therapy

## 2021-09-04 DIAGNOSIS — G8929 Other chronic pain: Secondary | ICD-10-CM

## 2021-09-04 DIAGNOSIS — M25612 Stiffness of left shoulder, not elsewhere classified: Secondary | ICD-10-CM

## 2021-09-04 DIAGNOSIS — M25512 Pain in left shoulder: Secondary | ICD-10-CM | POA: Diagnosis not present

## 2021-09-04 DIAGNOSIS — M6281 Muscle weakness (generalized): Secondary | ICD-10-CM

## 2021-09-04 NOTE — Therapy (Signed)
[facility] [place] [address] [place], [place], [address] Phone: [phone]   Fax:  [phone]  Physical Therapy Treatment  Patient Details  Name: Darius Roberts MRN: [id number] Date of Birth: 23-Jul-1965 Referring Provider (PT): Jones Broom, MD   Encounter Date: 09/04/2021   PT End of Session - 09/04/21 0715     Visit Number 2    Number of Visits 12    Date for PT Re-Evaluation 10/09/21    Authorization Type UHC    PT Start Time 0716    PT Stop Time 0800    PT Time Calculation (min) 44 min    Activity Tolerance Patient tolerated treatment well    Behavior During Therapy [facility] for tasks assessed/performed             Past Medical History:  Diagnosis Date   Absence of kidney 07/22/2014   Overview:  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   Subjective Assessment - 09/04/21 0718     Subjective Pt reports the foam roller is too big to squeeze his shoulder blades. Has downloaded the app and been working on his exercises.    Limitations Lifting    How long can you sit comfortably? n/a    How long can you stand comfortably? n/a    How long can you walk comfortably? n/a    Patient Stated Goals Improve pain and shoulder motion    Currently in Pain? 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    Referring Provider (PT) Jones Broom, MD                           [facility] Adult PT Treatment/Exercise - 09/04/21 0001       Exercises   Exercises  Shoulder      Shoulder Exercises: Standing   Row Strengthening;20 reps;Theraband    Theraband Level (Shoulder Row) Level 3 (Green)    Other Standing Exercises Back against pool noodle red tband: shoulder ER 2x10, "W" 2x10, horizontal abd 2x10    Other Standing Exercises --      Shoulder Exercises: Pulleys   Flexion 2 minutes    Scaption 2 minutes    ABduction 2 minutes      Shoulder Exercises: Stretch   Other Shoulder Stretches doorway pec stretch 60 and 90 deg 2x30 sec each                          PT Long Term Goals - 08/28/21 1612       PT LONG TERM GOAL #1   Title Pt will be independent with HEP    Time 6    Period Weeks    Status New    Target Date 10/09/21      PT LONG TERM GOAL #2  Title Pt will demo R = L shoulder ROM with </=1/10 pain    Time 6    Period Weeks    Status New    Target Date 10/09/21      PT LONG TERM GOAL #3   Title Pt will demo at least 4/5 posterior shoulder strength    Time 6    Period Weeks    Status New    Target Date 10/09/21      PT LONG TERM GOAL #4   Title Pt will be able to lift at least 5# overhead to reach in kitchen cabinets    Time 6    Period Weeks    Status New    Target Date 10/09/21                   Plan - 09/04/21 0731     Clinical Impression Statement Treatment focused on reviewing HEP and continuing to progress strengthening exercises. Very tight pecs noted. Initiated pec stretching. Has trialed TPDN in the past but notes mixed result.    Personal Factors and Comorbidities Fitness;Time since onset of injury/illness/exacerbation    Examination-Activity Limitations Reach Overhead;Carry;Lift;Hygiene/Grooming    Examination-Participation Restrictions Community Activity;Occupation    Stability/Clinical Decision Making Evolving/Moderate complexity    Rehab Potential Good    PT Frequency 2x / week    PT Duration 6 weeks    PT Treatment/Interventions ADLs/Self Care Home  Management;Cryotherapy;Electrical Stimulation;Iontophoresis 4mg /ml Dexamethasone;Moist Heat;DME Instruction;Functional mobility training;Therapeutic exercise;Therapeutic activities;Neuromuscular re-education;Manual techniques;Patient/family education;Passive range of motion;Dry needling;Taping    PT Next Visit Plan Assess response to HEP. Stretch pecs and lats. Scapular mobilizations and stability. Work on posterior shoulder girdle strengthening    PT Home Exercise Plan Access Code: [email or web address]    Consulted and Agree with Plan of Care Patient             Patient will benefit from skilled therapeutic intervention in order to improve the following deficits and impairments:  Decreased range of motion, Increased fascial restricitons, Increased muscle spasms, Impaired UE functional use, Decreased activity tolerance, Pain, Hypomobility, Improper body mechanics, Decreased mobility, Decreased strength, Postural dysfunction  Visit Diagnosis: Chronic left shoulder pain  Stiffness of left shoulder, not elsewhere classified  Muscle weakness (generalized)     Problem List Patient Active Problem List   Diagnosis Date Noted   Well adult exam 08/16/2021   Labral tear of shoulder 07/17/2021   Type 2 diabetes mellitus without complications (HCC) 09/09/2017   Achilles tendonitis, bilateral 09/13/2016   Absence of kidney 07/22/2014   Dyslipidemia 07/22/2014   Acid reflux 07/22/2014   Avitaminosis D 07/22/2014    Amelianna Meller April Ma L [place], PT 09/04/2021, 7:58 AM  [facility] [address] [place], [place], [place] Phone: [phone]   Fax:  [phone]  Name: Darius Roberts MRN: [name] Date of Birth: 1965/04/28

## 2021-09-06 ENCOUNTER — Encounter: Payer: Self-pay | Admitting: Rehabilitative and Restorative Service Providers"

## 2021-09-06 ENCOUNTER — Ambulatory Visit: Payer: 59 | Admitting: Rehabilitative and Restorative Service Providers"

## 2021-09-06 DIAGNOSIS — G8929 Other chronic pain: Secondary | ICD-10-CM

## 2021-09-06 DIAGNOSIS — M6281 Muscle weakness (generalized): Secondary | ICD-10-CM

## 2021-09-06 DIAGNOSIS — M25612 Stiffness of left shoulder, not elsewhere classified: Secondary | ICD-10-CM

## 2021-09-06 DIAGNOSIS — M25512 Pain in left shoulder: Secondary | ICD-10-CM | POA: Diagnosis not present

## 2021-09-06 NOTE — Patient Instructions (Signed)
Access Code: [email or web address] URL: [email or web address] Date: 09/06/2021 Prepared by: Corlis Leak  Exercises - Shoulder External Rotation and Scapular Retraction with Resistance  - 1 x daily - 7 x weekly - 2 sets - 10 reps - Shoulder W - External Rotation with Resistance  - 1 x daily - 7 x weekly - 2 sets - 10 reps - Standing Bilateral Low Shoulder Row with Anchored Resistance  - 1 x daily - 7 x weekly - 2 sets - 10 reps - Standing Shoulder Horizontal Abduction with Resistance  - 1 x daily - 7 x weekly - 2 sets - 10 reps - 2-3 sec hold - Doorway Pec Stretch at 90 Degrees Abduction  - 1 x daily - 7 x weekly - 2 sets - 30 sec hold - Doorway Pec Stretch at 60 Elevation  - 1 x daily - 7 x weekly - 2 sets - 30 sec hold - Doorway Pec Stretch at 120 Degrees Abduction  - 2 x daily - 7 x weekly - 1 sets - 3 reps - 30 sec  hold - Supine Chest Stretch on Foam Roll  - 2 x daily - 7 x weekly - 1 sets - 1 reps - 2-5 min  sec  hold - Standing Shoulder and Trunk Flexion at Table  - 2 x daily - 7 x weekly - 1 sets - 3 reps - 10 sec  hold

## 2021-09-06 NOTE — Therapy (Signed)
[facility] [place] [address] [place], [place], [address] Phone: [phone]   Fax:  [phone]  Physical Therapy Treatment Rationale for Evaluation and Treatment Rehabilitation  Patient Details  Name: Darius Roberts MRN: [id number] Date of Birth: 1965/10/08 Referring Provider (PT): Jones Broom, MD   Encounter Date: 09/06/2021   PT End of Session - 09/06/21 0716     Visit Number 3    Number of Visits 12    Date for PT Re-Evaluation 10/09/21    Authorization Type UHC    PT Start Time 0715    PT Stop Time 0816    PT Time Calculation (min) 61 min    Activity Tolerance Patient tolerated treatment well             Past Medical History:  Diagnosis Date   Absence of kidney 07/22/2014   Overview:  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   Subjective Assessment - 09/06/21 0721     Subjective Patient reports that he has some soreness in the Lt shoulder following last visit. He continues to work on his exercises.    Currently in Pain? Yes    Pain Score 4     Pain Location Shoulder    Pain Orientation Left    Pain Descriptors / Indicators Sore;Aching;Dull    Pain Type Chronic pain                               [facility] Adult PT Treatment/Exercise - 09/06/21 0001       Neuro Re-ed    Neuro Re-ed Details  neuromuscular re-education working on posterior shoulder girdle control in static posture and with movement of UE's at waist level      Shoulder Exercises: Standing   Row Strengthening;20 reps;Theraband    Theraband Level (Shoulder Row) Level 3 (Green)    Row Limitations focus on  posterior shoulder girdle control    Other Standing Exercises Back against pool noodle red tband:  shoulder ER 2x10, "W" 2x10, horizontal abd 2x10      Shoulder Exercises: Pulleys   Flexion 2 minutes    Flexion Limitations pause for stretch end range ~ 10 sec    Scaption 2 minutes    Scaption Limitations pause for stretch end range 10 sec      Shoulder Exercises: Stretch   Table Stretch - Flexion 3 reps;30 seconds    Table Stretch -Flexion Limitations hands on counter stretching into flexion and stretching lats    Other Shoulder Stretches doorway pec stretch 60 and 90 deg - added ~ 110 deg without stepping 2x30 sec each    Other Shoulder Stretches prolonged snow angel ~ 2 min UE's at ~ 90 degrees      Manual Therapy   Soft tissue mobilization deep tissue work through the [employer] - anterior pecs/deltiod/biceps; lateral scapular area through teres/lats; upper trap    Myofascial Release anterior chest    Passive ROM stretch into shoulder extension; horizontal abduction; ER to tissue tolerance                     PT Education - 09/06/21 0801     Education Details  HEP    Person(s) Educated Patient    Methods Explanation;Demonstration;Tactile cues;Verbal cues;Handout    Comprehension Verbalized understanding;Returned demonstration;Verbal cues required;Tactile cues required                 PT Long Term Goals - 08/28/21 1612       PT LONG TERM GOAL #1   Title Pt will be independent with HEP    Time 6    Period Weeks    Status New    Target Date 10/09/21      PT LONG TERM GOAL #2   Title Pt will demo R = L shoulder ROM with </=1/10 pain    Time 6    Period Weeks    Status New    Target Date 10/09/21      PT LONG TERM GOAL #3   Title Pt will demo at least 4/5 posterior shoulder strength    Time 6    Period Weeks    Status New    Target Date 10/09/21      PT LONG TERM GOAL #4   Title Pt will be able to lift at least 5# overhead to reach in kitchen cabinets    Time 6    Period Weeks    Status New    Target Date 10/09/21                    Plan - 09/06/21 0722     Clinical Impression Statement Continued work on Lt shoulder - ROM and posterior shoulder girdle strengthening. Manual work through the pecs/anterior shoulder girdle. PROM and stretching Lt shoulder extension; horizontal abduction; ER pt supine. Working on posture and alignment engaging posterior shoulder girdle. Neuromuscular re-education.    Rehab Potential Good    PT Frequency 2x / week    PT Duration 6 weeks    PT Treatment/Interventions ADLs/Self Care Home Management;Cryotherapy;Electrical Stimulation;Iontophoresis 4mg /ml Dexamethasone;Moist Heat;DME Instruction;Functional mobility training;Therapeutic exercise;Therapeutic activities;Neuromuscular re-education;Manual techniques;Patient/family education;Passive range of motion;Dry needling;Taping    PT Next Visit Plan Assess and progress HEP. Stretch pecs and lats. Scapular mobilizations and stability. Work on posterior shoulder girdle strengthening    PT Home Exercise Plan [email or web address]    Consulted and Agree with Plan of Care Patient             Patient will benefit from skilled therapeutic intervention in order to improve the following deficits and impairments:     Visit Diagnosis: Chronic left shoulder pain  Stiffness of left shoulder, not elsewhere classified  Muscle weakness (generalized)     Problem List Patient Active Problem List   Diagnosis Date Noted   Well adult exam 08/16/2021   Labral tear of shoulder 07/17/2021   Type 2 diabetes mellitus without complications (HCC) 09/09/2017   Achilles tendonitis, bilateral 09/13/2016   Absence of kidney 07/22/2014   Dyslipidemia 07/22/2014   Acid reflux 07/22/2014   Avitaminosis D 07/22/2014    Darius Roberts 07/24/2014, PT, MPH  09/06/2021, 8:25 AM  [facility] [address] [place], [place], [place] Phone: [phone]   Fax:  [phone]  Name: Darius Roberts MRN: [name] Date  of Birth: 02/14/1966

## 2021-09-11 ENCOUNTER — Encounter: Payer: Self-pay | Admitting: Rehabilitative and Restorative Service Providers"

## 2021-09-11 ENCOUNTER — Ambulatory Visit: Payer: 59 | Admitting: Rehabilitative and Restorative Service Providers"

## 2021-09-11 DIAGNOSIS — M25612 Stiffness of left shoulder, not elsewhere classified: Secondary | ICD-10-CM

## 2021-09-11 DIAGNOSIS — M25512 Pain in left shoulder: Secondary | ICD-10-CM | POA: Diagnosis not present

## 2021-09-11 DIAGNOSIS — G8929 Other chronic pain: Secondary | ICD-10-CM

## 2021-09-11 DIAGNOSIS — M6281 Muscle weakness (generalized): Secondary | ICD-10-CM

## 2021-09-11 NOTE — Patient Instructions (Signed)
Access Code: [email or web address] URL: [email or web address] Date: 09/11/2021 Prepared by: Corlis Leak  Exercises - Shoulder External Rotation and Scapular Retraction with Resistance  - 1 x daily - 7 x weekly - 2 sets - 10 reps - Shoulder W - External Rotation with Resistance  - 1 x daily - 7 x weekly - 2 sets - 10 reps - Standing Bilateral Low Shoulder Row with Anchored Resistance  - 1 x daily - 7 x weekly - 2 sets - 10 reps - Standing Shoulder Horizontal Abduction with Resistance  - 1 x daily - 7 x weekly - 2 sets - 10 reps - 2-3 sec hold - Doorway Pec Stretch at 90 Degrees Abduction  - 1 x daily - 7 x weekly - 2 sets - 30 sec hold - Doorway Pec Stretch at 60 Elevation  - 1 x daily - 7 x weekly - 2 sets - 30 sec hold - Doorway Pec Stretch at 120 Degrees Abduction  - 2 x daily - 7 x weekly - 1 sets - 3 reps - 30 sec  hold - Supine Chest Stretch on Foam Roll  - 2 x daily - 7 x weekly - 1 sets - 1 reps - 2-5 min  sec  hold - Standing Shoulder and Trunk Flexion at Table  - 2 x daily - 7 x weekly - 1 sets - 3 reps - 10 sec  hold - Bicep Stretch at Table  - 2 x daily - 7 x weekly - 1 sets - 3 reps - 30 sec  hold - Standing Bicep Stretch at Wall  - 2 x daily - 7 x weekly - 1 sets - 3 reps - 30 sec  hold - Drawing Bow  - 1 x daily - 7 x weekly - 1 sets - 10 reps - 3 sec  hold

## 2021-09-11 NOTE — Therapy (Signed)
[facility] [place] [address] [place], [place], [address] Phone: [phone]   Fax:  [phone]  Physical Therapy Treatment Rationale for Evaluation and Treatment Rehabilitation  Patient Details  Name: Darius Roberts MRN: [id number] Date of Birth: 1965-12-08 Referring Provider (PT): Jones Broom, MD   Encounter Date: 09/11/2021   PT End of Session - 09/11/21 0714     Visit Number 4    Number of Visits 12    Date for PT Re-Evaluation 10/09/21    PT Start Time 0714    PT Stop Time 0802    PT Time Calculation (min) 48 min    Activity Tolerance Patient tolerated treatment well             Past Medical History:  Diagnosis Date   Absence of kidney 07/22/2014   Overview:  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   Subjective Assessment - 09/11/21 0716     Subjective Patient reports that he is doing OK. Lt Shoulder is "coming along"    Currently in Pain? 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    Referring Provider (PT) Jones Broom, MD    Onset Date/Surgical Date 02/24/19    Hand Dominance Right    Next MD Visit 09/27/21    Prior Therapy for L shoulder x 6 months      Palpation   Palpation comment tight Lt shoulder joint with PROM/stretch into extension; horizontal abducation and ER w/ pt supine                           [facility] Adult PT  Treatment/Exercise - 09/11/21 0001       Shoulder Exercises: Supine   Other Supine Exercises scap squeeze 10 sec x 5 reps facilitation of posterior shoudler girdle      Shoulder Exercises: Standing   Row Strengthening;20 reps;Theraband    Theraband Level (Shoulder Row) Level 4 (Blue)    Row Limitations bow and arrow green TB x 10 each side VC and tactile cues to engage posterior shoulder girdle    Other Standing Exercises Back against pool noodle red tband: shoulder ER 2x10, "W" 2x10, horizontal abd 2x10      Shoulder Exercises: Pulleys   Flexion 2 minutes    Flexion Limitations pause for stretch end range ~ 10 sec    Scaption 2 minutes    Scaption Limitations pause for stretch end range 10 sec      Shoulder Exercises: Stretch   Table Stretch - Flexion 3 reps;30 seconds    Table Stretch -Flexion Limitations hands on counter stretching into flexion and stretching lats    Other Shoulder Stretches doorway pec stretch 60 and 90 deg - added ~  110 deg without stepping 2x30 sec each    Other Shoulder Stretches biceps stretch 30 sec x 2 reps; horizontal chest stretch 30 sec x 2 reps      Manual Therapy   Soft tissue mobilization deep tissue work through the [employer] - anterior pecs/deltiod/biceps; lateral scapular area through teres/lats; upper trap    Myofascial Release anterior chest    Passive ROM stretch into shoulder extension; horizontal abduction; ER to tissue tolerance                     PT Education - 09/11/21 0749     Education Details HEP    Person(s) Educated Patient    Methods Explanation;Demonstration;Tactile cues;Verbal cues;Handout    Comprehension Verbalized understanding;Returned demonstration;Verbal cues required;Tactile cues required                 PT Long Term Goals - 08/28/21 1612       PT LONG TERM GOAL #1   Title Pt will be independent with HEP    Time 6    Period Weeks    Status New    Target Date 10/09/21      PT LONG TERM  GOAL #2   Title Pt will demo R = L shoulder ROM with </=1/10 pain    Time 6    Period Weeks    Status New    Target Date 10/09/21      PT LONG TERM GOAL #3   Title Pt will demo at least 4/5 posterior shoulder strength    Time 6    Period Weeks    Status New    Target Date 10/09/21      PT LONG TERM GOAL #4   Title Pt will be able to lift at least 5# overhead to reach in kitchen cabinets    Time 6    Period Weeks    Status New    Target Date 10/09/21                   Plan - 09/11/21 0715     Clinical Impression Statement Patient reports consistiently doing exercises at home. He feels the shoulder is improving some. Continue with stretching and strengthening exercises as well as manual work Lt shoulder. Corrected stretching technique. Continued work on facilitation and control of posterior shoulder girdle/upper core with neuromuscular re-education. Tight joint capsule end ranges ext; horiz abd and ER    Rehab Potential Good    PT Frequency 2x / week    PT Duration 6 weeks    PT Treatment/Interventions ADLs/Self Care Home Management;Cryotherapy;Electrical Stimulation;Iontophoresis 4mg /ml Dexamethasone;Moist Heat;DME Instruction;Functional mobility training;Therapeutic exercise;Therapeutic activities;Neuromuscular re-education;Manual techniques;Patient/family education;Passive range of motion;Dry needling;Taping    PT Next Visit Plan Assess and progress HEP. Stretch pecs and lats. Scapular mobilizations and stability. Work on posterior shoulder girdle strengthening    PT Home Exercise Plan [email or web address]    Consulted and Agree with Plan of Care Patient             Patient will benefit from skilled therapeutic intervention in order to improve the following deficits and impairments:     Visit Diagnosis: Chronic left shoulder pain  Stiffness of left shoulder, not elsewhere classified  Muscle weakness (generalized)     Problem List Patient Active Problem List    Diagnosis Date Noted   Well adult exam 08/16/2021   Labral tear of shoulder 07/17/2021   Type 2 diabetes mellitus without complications (HCC) 09/09/2017  Achilles tendonitis, bilateral 09/13/2016   Absence of kidney 07/22/2014   Dyslipidemia 07/22/2014   Acid reflux 07/22/2014   Avitaminosis D 07/22/2014    Edythe Riches Rober Minion, PT, MPH  09/11/2021, 4:26 PM  [facility] [address] [place], [place], [address] Phone: [phone]   Fax:  [phone]  Name: Darius Roberts MRN: [id number] Date of Birth: [birth year]

## 2021-09-13 ENCOUNTER — Ambulatory Visit: Payer: 59 | Admitting: Physical Therapy

## 2021-09-13 DIAGNOSIS — M25512 Pain in left shoulder: Secondary | ICD-10-CM | POA: Diagnosis not present

## 2021-09-13 DIAGNOSIS — M25612 Stiffness of left shoulder, not elsewhere classified: Secondary | ICD-10-CM

## 2021-09-13 DIAGNOSIS — G8929 Other chronic pain: Secondary | ICD-10-CM

## 2021-09-13 DIAGNOSIS — M6281 Muscle weakness (generalized): Secondary | ICD-10-CM

## 2021-09-13 NOTE — Therapy (Signed)
[facility] [place] [address] [place], [place], [address] Phone: [phone]   Fax:  [phone]  Physical Therapy Treatment  Patient Details  Name: Darius Roberts MRN: [id number] Date of Birth: October 19, 1965 Referring Provider (PT): Jones Broom, MD   Encounter Date: 09/13/2021   PT End of Session - 09/13/21 0716     Visit Number 5    Number of Visits 12    Date for PT Re-Evaluation 10/09/21    PT Start Time 0716    PT Stop Time 0800    PT Time Calculation (min) 44 min    Activity Tolerance Patient tolerated treatment well    Behavior During Therapy [facility] for tasks assessed/performed             Past Medical History:  Diagnosis Date   Absence of kidney 07/22/2014   Overview:  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   Subjective Assessment - 09/13/21 0719     Subjective Pt states that he has been doing exercises. Pt reports that he finds himself going too hard with the doorway stretch.    Limitations Lifting    How long can you sit comfortably? n/a    How long can you stand comfortably? n/a    How long can you walk comfortably? n/a    Patient Stated Goals Improve pain and shoulder motion    Currently in Pain? 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    Referring Provider (PT) Jones Broom, MD    Onset Date/Surgical Date 02/24/19    Hand Dominance Right                           [facility] Adult PT Treatment/Exercise - 09/13/21 0001       Shoulder Exercises: Supine   External Rotation  Strengthening;Left;20 reps    External Rotation Weight (lbs) 1    External Rotation Limitations in 45 deg abd; manual assist to maintain scapular positioning    Internal Rotation Strengthening;20 reps    Internal Rotation Weight (lbs) 1    Internal Rotation Limitations in 45 deg abd; manual assist to maintain scapular positioning      Shoulder Exercises: Sidelying   Other Sidelying Exercises Retraction x10; Scapular clock 12-3-6-9 x5; scapular clock 3-9 x5, 3-6-9 x 5 each way      Shoulder Exercises: Pulleys   Flexion 2 minutes    Scaption 2 minutes      Shoulder Exercises: Stretch   Other Shoulder Stretches supine ER/IR stretch contract/relax 5x5 sec    Other Shoulder Stretches supine snow angel x30 sec      Manual Therapy   Manual Therapy Joint mobilization    Joint Mobilization grade II to III inferior and post GH mobs    Myofascial Release anterior chest    Passive ROM Shoulder ER/IR; scapular mobilization  PT Long Term Goals - 08/28/21 1612       PT LONG TERM GOAL #1   Title Pt will be independent with HEP    Time 6    Period Weeks    Status New    Target Date 10/09/21      PT LONG TERM GOAL #2   Title Pt will demo R = L shoulder ROM with </=1/10 pain    Time 6    Period Weeks    Status New    Target Date 10/09/21      PT LONG TERM GOAL #3   Title Pt will demo at least 4/5 posterior shoulder strength    Time 6    Period Weeks    Status New    Target Date 10/09/21      PT LONG TERM GOAL #4   Title Pt will be able to lift at least 5# overhead to reach in kitchen cabinets    Time 6    Period Weeks    Status New    Target Date 10/09/21                   Plan - 09/13/21 0828     Clinical Impression Statement Session focused primarily on neuromuscular re-ed to improve scapular mobility. Continued to work on improving joint capsule tightness as well as muscular tightness. Very tender along levator scapulae and  thoracic paraspinals.    Personal Factors and Comorbidities Fitness;Time since onset of injury/illness/exacerbation    Examination-Activity Limitations Reach Overhead;Carry;Lift;Hygiene/Grooming    Examination-Participation Restrictions Community Activity;Occupation    Stability/Clinical Decision Making Evolving/Moderate complexity    Rehab Potential Good    PT Frequency 2x / week    PT Duration 6 weeks    PT Treatment/Interventions ADLs/Self Care Home Management;Cryotherapy;Electrical Stimulation;Iontophoresis 4mg /ml Dexamethasone;Moist Heat;DME Instruction;Functional mobility training;Therapeutic exercise;Therapeutic activities;Neuromuscular re-education;Manual techniques;Patient/family education;Passive range of motion;Dry needling;Taping    PT Next Visit Plan Assess and progress HEP. Stretch joint, pecs and lats. Scapular mobilizations and stability. Work on posterior shoulder girdle strengthening    PT Home Exercise Plan [email or web address]    Consulted and Agree with Plan of Care Patient             Patient will benefit from skilled therapeutic intervention in order to improve the following deficits and impairments:  Decreased range of motion, Increased fascial restricitons, Increased muscle spasms, Impaired UE functional use, Decreased activity tolerance, Pain, Hypomobility, Improper body mechanics, Decreased mobility, Decreased strength, Postural dysfunction  Visit Diagnosis: Chronic left shoulder pain  Stiffness of left shoulder, not elsewhere classified  Muscle weakness (generalized)     Problem List Patient Active Problem List   Diagnosis Date Noted   Well adult exam 08/16/2021   Labral tear of shoulder 07/17/2021   Type 2 diabetes mellitus without complications (HCC) 09/09/2017   Achilles tendonitis, bilateral 09/13/2016   Absence of kidney 07/22/2014   Dyslipidemia 07/22/2014   Acid reflux 07/22/2014   Avitaminosis D 07/22/2014    Correen Bubolz April Ma L Lamarcus Spira, PT,  DPT 09/13/2021, 8:32 AM  [facility] [address] [place] [address] [place], [place], [place] Phone: [phone]   Fax:  [phone]  Name: Darius Roberts MRN: [name] Date of Birth: [birth year]

## 2021-09-18 ENCOUNTER — Ambulatory Visit: Payer: 59 | Admitting: Physical Therapy

## 2021-09-18 DIAGNOSIS — M25512 Pain in left shoulder: Secondary | ICD-10-CM | POA: Diagnosis not present

## 2021-09-18 DIAGNOSIS — M6281 Muscle weakness (generalized): Secondary | ICD-10-CM

## 2021-09-18 DIAGNOSIS — M25612 Stiffness of left shoulder, not elsewhere classified: Secondary | ICD-10-CM

## 2021-09-18 DIAGNOSIS — G8929 Other chronic pain: Secondary | ICD-10-CM

## 2021-09-20 ENCOUNTER — Ambulatory Visit: Payer: 59 | Admitting: Rehabilitative and Restorative Service Providers"

## 2021-09-20 ENCOUNTER — Encounter: Payer: Self-pay | Admitting: Rehabilitative and Restorative Service Providers"

## 2021-09-20 DIAGNOSIS — M6281 Muscle weakness (generalized): Secondary | ICD-10-CM

## 2021-09-20 DIAGNOSIS — M25512 Pain in left shoulder: Secondary | ICD-10-CM | POA: Diagnosis not present

## 2021-09-20 DIAGNOSIS — G8929 Other chronic pain: Secondary | ICD-10-CM

## 2021-09-20 DIAGNOSIS — M25612 Stiffness of left shoulder, not elsewhere classified: Secondary | ICD-10-CM

## 2021-09-20 NOTE — Patient Instructions (Addendum)
Trigger Point Dry Needling  What is Trigger Point Dry Needling (DN)? DN is a physical therapy technique used to treat muscle pain and dysfunction. Specifically, DN helps deactivate muscle trigger points (muscle knots).  A thin filiform needle is used to penetrate the skin and stimulate the underlying trigger point. The goal is for a local twitch response (LTR) to occur and for the trigger point to relax. No medication of any kind is injected during the procedure.   What Does Trigger Point Dry Needling Feel Like?  The procedure feels different for each individual patient. Some patients report that they do not actually feel the needle enter the skin and overall the process is not painful. Very mild bleeding may occur. However, many patients feel a deep cramping in the muscle in which the needle was inserted. This is the local twitch response.   How Will I feel after the treatment? Soreness is normal, and the onset of soreness may not occur for a few hours. Typically this soreness does not last longer than two days.  Bruising is uncommon, however; ice can be used to decrease any possible bruising.  In rare cases feeling tired or nauseous after the treatment is normal. In addition, your symptoms may get worse before they get better, this period will typically not last longer than 24 hours.   What Can I do After My Treatment? Increase your hydration by drinking more water for the next 24 hours. You may place ice or heat on the areas treated that have become sore, however, do not use heat on inflamed or bruised areas. Heat often brings more relief post needling. You can continue your regular activities, but vigorous activity is not recommended initially after the treatment for 24 hours. DN is best combined with other physical therapy such as strengthening, stretching, and other therapies.  Access Code: [email or web address] URL: [email or web address] Date: 09/20/2021 Prepared by: Corlis Leak  Exercises - Shoulder External Rotation and Scapular Retraction with Resistance  - 1 x daily - 7 x weekly - 2 sets - 10 reps - Shoulder W - External Rotation with Resistance  - 1 x daily - 7 x weekly - 2 sets - 10 reps - Standing Bilateral Low Shoulder Row with Anchored Resistance  - 1 x daily - 7 x weekly - 2 sets - 10 reps - Standing Shoulder Horizontal Abduction with Resistance  - 1 x daily - 7 x weekly - 2 sets - 10 reps - 2-3 sec hold - Doorway Pec Stretch at 90 Degrees Abduction  - 1 x daily - 7 x weekly - 2 sets - 30 sec hold - Doorway Pec Stretch at 60 Elevation  - 1 x daily - 7 x weekly - 2 sets - 30 sec hold - Doorway Pec Stretch at 120 Degrees Abduction  - 2 x daily - 7 x weekly - 1 sets - 3 reps - 30 sec  hold - Supine Chest Stretch on Foam Roll  - 2 x daily - 7 x weekly - 1 sets - 1 reps - 2-5 min  sec  hold - Standing Shoulder and Trunk Flexion at Table  - 2 x daily - 7 x weekly - 1 sets - 3 reps - 10 sec  hold - Bicep Stretch at Table  - 2 x daily - 7 x weekly - 1 sets - 3 reps - 30 sec  hold - Standing Bicep Stretch at Wall  - 2 x daily - 7 x weekly - 1  sets - 3 reps - 30 sec  hold - Drawing Bow  - 1 x daily - 7 x weekly - 1 sets - 10 reps - 3 sec  hold - Seated Scapular Clock (1 to 7)  - 1 x daily - 7 x weekly - 1 sets - 10 reps - Seated Scapular Clock (11 to 5)  - 1 x daily - 7 x weekly - 1 sets - 10 reps - Gentle Levator Scapulae Stretch  - 1 x daily - 7 x weekly - 2 sets - 30 sec hold - Standing Shoulder Flexion Stretch on Wall  - 2 x daily - 7 x weekly - 2 sets - 3 reps - 30 sec  hold

## 2021-09-20 NOTE — Therapy (Signed)
[facility] [place] [address] [place], [place], [address] Phone: [phone]   Fax:  [phone]  Physical Therapy Treatment  Rationale for Evaluation and Treatment Rehabilitation  Patient Details  Name: Darius Roberts MRN: [id number] Date of Birth: 03/03/1966 Referring Provider (PT): Jones Broom, MD   Encounter Date: 09/20/2021   PT End of Session - 09/20/21 0715     Visit Number 7    Number of Visits 12    Date for PT Re-Evaluation 10/09/21    Authorization Type [employer]    PT Start Time 0714    PT Stop Time 0805    PT Time Calculation (min) 51 min    Activity Tolerance Patient tolerated treatment well             Past Medical History:  Diagnosis Date   Absence of kidney 07/22/2014   Overview:  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   Subjective Assessment - 09/20/21 0715     Subjective Patient reports that his shoulder is better than it was Monday. He has less tightness today. He feels that his shoulder may be "a little better"    Currently in Pain? 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    Referring Provider (PT) Jones Broom, MD    Onset Date/Surgical Date 02/24/19    Hand Dominance Right    Next MD Visit to schedule    Prior  Therapy for L shoulder x 6 months      AROM   Left Shoulder Extension 55 Degrees    Left Shoulder Flexion 143 Degrees    Left Shoulder ABduction 148 Degrees    Left Shoulder Internal Rotation --   thumb T10 painful   Left Shoulder External Rotation --   middle finger to T4     Strength   Left Shoulder Flexion 5/5    Left Shoulder Extension 5/5    Left Shoulder ABduction 5/5    Left Shoulder Internal Rotation 5/5    Left Shoulder External Rotation 5/5    Left Shoulder Horizontal ABduction 5/5    Left Shoulder Horizontal ADduction 5/5      Palpation   Palpation comment tight Lt shoulder joint with PROM/stretch into extension; horizontal abducation and ER w/ pt supine                           [facility] Adult PT Treatment/Exercise - 09/20/21 0001       Shoulder Exercises: Pulleys   Flexion 2 minutes    Scaption 2 minutes      Shoulder Exercises: Stretch  Other Shoulder Stretches doorway stretch 3 positions 30 sec hold x 2 reps each    Other Shoulder Stretches shoulder flexion stepping under dowel in doorway 30 sec x 2 reps      Manual Therapy   Manual therapy comments skilled palpation to assess response to DN and manual work    Joint Mobilization grade II to III inferior and post GH mobs    Soft tissue mobilization deep tissue work through the [employer] - anterior pecs/deltiod/biceps; lateral scapular area through teres/lats; upper trap    Passive ROM lt shoulder flexion with disassociation of scapular and humerus; ER; horizontal abduction; horizontal abduction with gentle ER              Trigger Point Dry Needling - 09/20/21 0001     Consent Given? Yes    Education Handout Provided Yes    Electrical Stimulation Performed with Dry Needling Yes    Other Dry Needling mAmp x 5 min    Teres major Response Palpable increased muscle length    Teres minor Response Palpable increased muscle length                   PT Education - 09/20/21 0749      Education Details HEP DN    Person(s) Educated Patient    Methods Explanation;Demonstration;Tactile cues;Verbal cues;Handout    Comprehension Verbalized understanding;Returned demonstration;Verbal cues required;Tactile cues required                 PT Long Term Goals - 08/28/21 1612       PT LONG TERM GOAL #1   Title Pt will be independent with HEP    Time 6    Period Weeks    Status New    Target Date 10/09/21      PT LONG TERM GOAL #2   Title Pt will demo R = L shoulder ROM with </=1/10 pain    Time 6    Period Weeks    Status New    Target Date 10/09/21      PT LONG TERM GOAL #3   Title Pt will demo at least 4/5 posterior shoulder strength    Time 6    Period Weeks    Status New    Target Date 10/09/21      PT LONG TERM GOAL #4   Title Pt will be able to lift at least 5# overhead to reach in kitchen cabinets    Time 6    Period Weeks    Status New    Target Date 10/09/21                   Plan - 09/20/21 0719     Clinical Impression Statement Continued pain. Shoulder feels better and looser after manual work in PT. He continues to report constant pain and tightness in the shoulder. Good gains in strength and some improvement in AROM. Patient has continued tightness in the glenohumeral joint and limited PROM/shoulder mobility. Trial of DN teres/lateral scapular border    Rehab Potential Good    PT Frequency 2x / week    PT Duration 6 weeks    PT Treatment/Interventions ADLs/Self Care Home Management;Cryotherapy;Electrical Stimulation;Iontophoresis 4mg /ml Dexamethasone;Moist Heat;DME Instruction;Functional mobility training;Therapeutic exercise;Therapeutic activities;Neuromuscular re-education;Manual techniques;Patient/family education;Passive range of motion;Dry needling;Taping    PT Next Visit Plan Assess response to DN and progress HEP. Stretch joint, pecs and lats. Scapular mobilizations and stability. Work on posterior shoulder girdle  strengthening  PT Home Exercise Plan [email or web address]    Consulted and Agree with Plan of Care Patient             Patient will benefit from skilled therapeutic intervention in order to improve the following deficits and impairments:     Visit Diagnosis: Chronic left shoulder pain  Stiffness of left shoulder, not elsewhere classified  Muscle weakness (generalized)     Problem List Patient Active Problem List   Diagnosis Date Noted   Well adult exam 08/16/2021   Labral tear of shoulder 07/17/2021   Type 2 diabetes mellitus without complications ([place]) [id number]   Achilles tendonitis, bilateral 09/13/2016   Absence of kidney 07/22/2014   Dyslipidemia 07/22/2014   Acid reflux 07/22/2014   Avitaminosis D 07/22/2014    Jatia Musa Nilda Simmer, PT, MPH  09/20/2021, 8:03 AM  [facility] [place] [address] [place], [place], [address] Phone: [phone]   Fax:  [phone]  Name: Darius Roberts MRN: [id number] Date of Birth: [birth year]

## 2021-10-02 ENCOUNTER — Encounter: Payer: Self-pay | Admitting: Rehabilitative and Restorative Service Providers"

## 2021-10-02 ENCOUNTER — Ambulatory Visit: Payer: 59 | Attending: Orthopedic Surgery | Admitting: Rehabilitative and Restorative Service Providers"

## 2021-10-02 DIAGNOSIS — M25612 Stiffness of left shoulder, not elsewhere classified: Secondary | ICD-10-CM | POA: Diagnosis present

## 2021-10-02 DIAGNOSIS — G8929 Other chronic pain: Secondary | ICD-10-CM | POA: Insufficient documentation

## 2021-10-02 DIAGNOSIS — M25512 Pain in left shoulder: Secondary | ICD-10-CM | POA: Insufficient documentation

## 2021-10-02 DIAGNOSIS — M6281 Muscle weakness (generalized): Secondary | ICD-10-CM | POA: Diagnosis present

## 2021-10-02 NOTE — Therapy (Signed)
[facility] [place] [address] [place], [place], [address] Phone: [phone]   Fax:  [phone]  Physical Therapy Treatment  Rationale for Evaluation and Treatment Rehabilitation  Patient Details  Name: Darius Roberts MRN: [id number] Date of Birth: [birth year] Referring Provider (PT): Jones Broom, MD   Encounter Date: 10/02/2021   PT End of Session - 10/02/21 0804     Visit Number 8    Number of Visits 12    Date for PT Re-Evaluation 10/09/21    Authorization Type UHC    PT Start Time 0802    PT Stop Time 0850    PT Time Calculation (min) 48 min    Activity Tolerance Patient tolerated treatment well             Past Medical History:  Diagnosis Date   Absence of kidney 07/22/2014   Overview:  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   Subjective Assessment - 10/02/21 0805     Subjective Patient reports that he has made "lttle bits of progress". He has benefit from the DN and deep tissue work. Would love to avoid surgery if at all possible.    Currently in Pain? 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    Referring Provider (PT) Jones Broom, MD    Onset Date/Surgical Date 02/24/19    Hand Dominance Right    Next MD Visit to schedule    Prior Therapy for L shoulder x 6 months      Palpation   Palpation comment tight Lt shoulder joint with PROM/stretch into extension; horizontal abducation and ER w/ pt supine                            [facility] Adult PT Treatment/Exercise - 10/02/21 0001       Shoulder Exercises: Supine   Other Supine Exercises trunk rotation supine 20-30 sec x 3 reps each side; lat stretch supine 30 sec hold x 3 reps      Shoulder Exercises: Sidelying   Other Sidelying Exercises retraction x10; scapular posterior tilting ("11 to 5" scapular clock) x10      Shoulder Exercises: ROM/Strengthening   UBE (Upper Arm Bike) L3 x 4 min 2 min fwd/2 min back      Shoulder Exercises: Stretch   Other Shoulder Stretches doorway stretch 3 positions 30 sec hold x 2 reps each    Other Shoulder Stretches shoulder flexion stepping under dowel in doorway 30 sec x 2 reps      Moist Heat Therapy   Number Minutes Moist Heat 10 Minutes    Moist Heat Location Shoulder      Manual Therapy   Manual therapy comments skilled palpation to assess response to DN and manual work    Joint Mobilization grade II to  III inferior and post GH mobs    Soft tissue mobilization deep tissue work through the [employer] - anterior pecs/deltiod/biceps; lateral scapular area through teres/lats; upper trap    Myofascial Release anterior chest    Passive ROM Lt shoulder flexion with disassociation of scapular and humerus; ER; horizontal abduction; horizontal abduction with gentle ER                     PT Education - 10/02/21 0814     Education Details HEP    Person(s) Educated Patient    Methods Explanation;Demonstration;Tactile cues;Verbal cues;Handout    Comprehension Verbalized understanding;Returned demonstration;Verbal cues required;Tactile cues required                 PT Long Term Goals - 08/28/21 1612       PT LONG TERM GOAL #1   Title Pt will be independent with HEP    Time 6    Period Weeks    Status New    Target Date 10/09/21      PT LONG TERM GOAL #2   Title Pt will demo R = L shoulder ROM with </=1/10 pain    Time 6    Period Weeks    Status New    Target  Date 10/09/21      PT LONG TERM GOAL #3   Title Pt will demo at least 4/5 posterior shoulder strength    Time 6    Period Weeks    Status New    Target Date 10/09/21      PT LONG TERM GOAL #4   Title Pt will be able to lift at least 5# overhead to reach in kitchen cabinets    Time 6    Period Weeks    Status New    Target Date 10/09/21                   Plan - 10/02/21 0806     Clinical Impression Statement Continued soreness and aching in the Lt shoulder which is describedas aching and soreness. Note continued papable tightness through the Lt shoulder girdle into the lats. Mixed response to DN and manual work with continued palpable tightness and decreased shoulder mobility noted with manual work.    Rehab Potential Good    PT Frequency 2x / week    PT Duration 6 weeks    PT Treatment/Interventions ADLs/Self Care Home Management;Cryotherapy;Electrical Stimulation;Iontophoresis 4mg /ml Dexamethasone;Moist Heat;DME Instruction;Functional mobility training;Therapeutic exercise;Therapeutic activities;Neuromuscular re-education;Manual techniques;Patient/family education;Passive range of motion;Dry needling;Taping    PT Next Visit Plan Continue DN and progress HEP. Stretch joint, pecs and lats. Scapular mobilizations and stability. Work on posterior shoulder girdle strengthening    PT Home Exercise Plan [email or web address]    Consulted and Agree with Plan of Care Patient             Patient will benefit from skilled therapeutic intervention in order to improve the following deficits and impairments:     Visit Diagnosis: Chronic left shoulder pain  Stiffness of left shoulder, not elsewhere classified  Muscle weakness (generalized)     Problem List Patient Active Problem List   Diagnosis Date Noted   Well adult exam 08/16/2021   Labral tear of shoulder 07/17/2021   Type 2 diabetes mellitus without complications (HCC) 09/09/2017   Achilles tendonitis, bilateral 09/13/2016    Absence of kidney 07/22/2014   Dyslipidemia 07/22/2014   Acid reflux 07/22/2014   Avitaminosis D 07/22/2014  Everardo All, PT, MPH  10/02/2021, 8:45 AM  [facility] [place], [place], [address] Phone: [phone]   Fax:  [phone]  Name: Darius Roberts MRN: [id number] Date of Birth: 1965/08/16

## 2021-10-02 NOTE — Patient Instructions (Signed)
Access Code: [email or web address] URL: [email or web address] Date: 10/02/2021 Prepared by: Corlis Leak  Exercises - Shoulder External Rotation and Scapular Retraction with Resistance  - 1 x daily - 7 x weekly - 2 sets - 10 reps - Shoulder W - External Rotation with Resistance  - 1 x daily - 7 x weekly - 2 sets - 10 reps - Standing Bilateral Low Shoulder Row with Anchored Resistance  - 1 x daily - 7 x weekly - 2 sets - 10 reps - Standing Shoulder Horizontal Abduction with Resistance  - 1 x daily - 7 x weekly - 2 sets - 10 reps - 2-3 sec hold - Doorway Pec Stretch at 90 Degrees Abduction  - 1 x daily - 7 x weekly - 2 sets - 30 sec hold - Doorway Pec Stretch at 60 Elevation  - 1 x daily - 7 x weekly - 2 sets - 30 sec hold - Doorway Pec Stretch at 120 Degrees Abduction  - 2 x daily - 7 x weekly - 1 sets - 3 reps - 30 sec  hold - Supine Chest Stretch on Foam Roll  - 2 x daily - 7 x weekly - 1 sets - 1 reps - 2-5 min  sec  hold - Standing Shoulder and Trunk Flexion at Table  - 2 x daily - 7 x weekly - 1 sets - 3 reps - 10 sec  hold - Bicep Stretch at Table  - 2 x daily - 7 x weekly - 1 sets - 3 reps - 30 sec  hold - Standing Bicep Stretch at Wall  - 2 x daily - 7 x weekly - 1 sets - 3 reps - 30 sec  hold - Drawing Bow  - 1 x daily - 7 x weekly - 1 sets - 10 reps - 3 sec  hold - Seated Scapular Clock (1 to 7)  - 1 x daily - 7 x weekly - 1 sets - 10 reps - Seated Scapular Clock (11 to 5)  - 1 x daily - 7 x weekly - 1 sets - 10 reps - Gentle Levator Scapulae Stretch  - 1 x daily - 7 x weekly - 2 sets - 30 sec hold - Standing Shoulder Flexion Stretch on Wall  - 2 x daily - 7 x weekly - 2 sets - 3 reps - 30 sec  hold - Supine Lower Trunk Rotation  - 2 x daily - 7 x weekly - 1 sets - 3-5 reps - 20-30 sec  hold - Supine Double Knee to Chest  - 2 x daily - 7 x weekly - 1 sets - 3-5 reps - 10-15 sec  hold

## 2021-10-04 ENCOUNTER — Ambulatory Visit: Payer: 59 | Admitting: Physical Therapy

## 2021-10-04 DIAGNOSIS — G8929 Other chronic pain: Secondary | ICD-10-CM

## 2021-10-04 DIAGNOSIS — M25512 Pain in left shoulder: Secondary | ICD-10-CM | POA: Diagnosis not present

## 2021-10-04 DIAGNOSIS — M25612 Stiffness of left shoulder, not elsewhere classified: Secondary | ICD-10-CM

## 2021-10-04 DIAGNOSIS — M6281 Muscle weakness (generalized): Secondary | ICD-10-CM

## 2021-10-04 NOTE — Therapy (Signed)
[facility] [place] [address] [place], [place], [address] Phone: [phone]   Fax:  [phone]  Physical Therapy Treatment  Patient Details  Name: Darius Roberts MRN: [id number] Date of Birth: 1965-04-19 Referring Provider (PT): Darius Broom, MD  Rationale for Evaluation and Treatment Rehabilitation  Encounter Date: 10/04/2021   PT End of Session - 10/04/21 0807     Visit Number 9    Number of Visits 12    Date for PT Re-Evaluation 10/09/21    Authorization Type UHC    PT Start Time 0719    PT Stop Time 0812    PT Time Calculation (min) 53 min    Activity Tolerance Patient tolerated treatment well    Behavior During Therapy [facility] for tasks assessed/performed             Past Medical History:  Diagnosis Date   Absence of kidney 07/22/2014   Overview:  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   Subjective Assessment - 10/04/21 0808     Subjective Reports continued aching. Has been trying to do his exercise. Still notes shaking of his lats with arm exercises.    Limitations Lifting    How long can you sit comfortably? n/a    How long can you stand comfortably? n/a    How long can you walk comfortably? n/a    Patient Stated Goals Improve pain and shoulder motion    Currently in Pain? Yes    Pain Score 3     Pain Location Shoulder    Pain Orientation Left                                             PT Long Term Goals - 08/28/21 1612       PT LONG TERM GOAL #1   Title Pt will be independent with HEP    Time 6    Period Weeks    Status New    Target Date 10/09/21      PT LONG TERM GOAL #2   Title Pt will demo R = L shoulder ROM with </=1/10 pain    Time 6     Period Weeks    Status New    Target Date 10/09/21      PT LONG TERM GOAL #3   Title Pt will demo at least 4/5 posterior shoulder strength    Time 6    Period Weeks    Status New    Target Date 10/09/21      PT LONG TERM GOAL #4   Title Pt will be able to lift at least 5# overhead to reach in kitchen cabinets    Time 6    Period Weeks    Status New    Target Date 10/09/21                   Plan - 10/04/21 0810     Clinical Impression Statement Continued to work on improving joint mobility. Improving scapular mobility and coordination but remains limited with higher ranges of elevation. Stretched lats.    Personal Factors and  Comorbidities Fitness;Time since onset of injury/illness/exacerbation    Examination-Activity Limitations Reach Overhead;Carry;Lift;Hygiene/Grooming    Examination-Participation Restrictions Community Activity;Occupation    Rehab Potential Good    PT Frequency 2x / week    PT Duration 6 weeks    PT Treatment/Interventions ADLs/Self Care Home Management;Cryotherapy;Electrical Stimulation;Iontophoresis 4mg /ml Dexamethasone;Moist Heat;DME Instruction;Functional mobility training;Therapeutic exercise;Therapeutic activities;Neuromuscular re-education;Manual techniques;Patient/family education;Passive range of motion;Dry needling;Taping    PT Next Visit Plan Continue DN and progress HEP. Stretch joint, pecs and lats. Scapular mobilizations and stability. Work on posterior shoulder girdle strengthening    PT Home Exercise Plan [email or web address]    Consulted and Agree with Plan of Care Patient             Patient will benefit from skilled therapeutic intervention in order to improve the following deficits and impairments:  Decreased range of motion, Increased fascial restricitons, Increased muscle spasms, Impaired UE functional use, Decreased activity tolerance, Pain, Hypomobility, Improper body mechanics, Decreased mobility, Decreased strength, Postural  dysfunction  Visit Diagnosis: Chronic left shoulder pain  Stiffness of left shoulder, not elsewhere classified  Muscle weakness (generalized)     Problem List Patient Active Problem List   Diagnosis Date Noted   Well adult exam 08/16/2021   Labral tear of shoulder 07/17/2021   Type 2 diabetes mellitus without complications (HCC) 09/09/2017   Achilles tendonitis, bilateral 09/13/2016   Absence of kidney 07/22/2014   Dyslipidemia 07/22/2014   Acid reflux 07/22/2014   Avitaminosis D 07/22/2014    Chaniya Genter April Ma L Kailea Dannemiller, PT, DPT 10/04/2021, 8:20 AM  [facility] [address] [place], [place], [place] Phone: [phone]   Fax:  [phone]  Name: Darius Roberts MRN: [name] Date of Birth: [birth year]

## 2021-10-09 ENCOUNTER — Ambulatory Visit: Payer: 59 | Admitting: Rehabilitative and Restorative Service Providers"

## 2021-10-09 ENCOUNTER — Encounter: Payer: Self-pay | Admitting: Rehabilitative and Restorative Service Providers"

## 2021-10-09 DIAGNOSIS — G8929 Other chronic pain: Secondary | ICD-10-CM

## 2021-10-09 DIAGNOSIS — M25512 Pain in left shoulder: Secondary | ICD-10-CM | POA: Diagnosis not present

## 2021-10-09 DIAGNOSIS — M6281 Muscle weakness (generalized): Secondary | ICD-10-CM

## 2021-10-09 DIAGNOSIS — M25612 Stiffness of left shoulder, not elsewhere classified: Secondary | ICD-10-CM

## 2021-10-09 NOTE — Therapy (Signed)
[facility] [place] [address] [place], [place], [address] Phone: [phone]   Fax:  [phone]  Physical Therapy Treatment  Rationale for Evaluation and Treatment Rehabilitation  Patient Details  Name: Darius Roberts MRN: [id number] Date of Birth: 1965-07-10 Referring Provider (PT): Jones Broom, MD   Encounter Date: 10/09/2021   PT End of Session - 10/09/21 0727     Visit Number 10    Number of Visits 20    Date for PT Re-Evaluation 11/20/21    Authorization Type UHC    PT Start Time 0726    PT Stop Time 0805    PT Time Calculation (min) 39 min    Activity Tolerance Patient tolerated treatment well             Past Medical History:  Diagnosis Date   Absence of kidney 07/22/2014   Overview:  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   Subjective Assessment - 10/09/21 0729     Subjective Patient reports that he has continued tightness in the Lt shoulder. Has postponed his MD appointment. He is doing some exercises every other day.    Currently in Pain? 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    Referring Provider (PT) Jones Broom, MD    Onset Date/Surgical Date 02/24/19    Hand Dominance Right    Next MD Visit to schedule    Prior Therapy for L shoulder x 6 months      PROM   Right Shoulder Extension 84 Degrees    Left Shoulder Extension 70 Degrees       Strength   Overall Strength Comments weak posterior shoulder girdle    Left Shoulder Flexion 5/5    Left Shoulder Extension 5/5    Left Shoulder ABduction 5/5    Left Shoulder Internal Rotation 5/5    Left Shoulder External Rotation 5/5    Left Shoulder Horizontal ABduction 5/5    Left Shoulder Horizontal ADduction 5/5      Palpation   Palpation comment tight Lt shoulder joint with PROM/stretch into extension; horizontal abducation and ER w/ pt supine                           [facility] Adult PT Treatment/Exercise - 10/09/21 0001       Self-Care   Other Self-Care Comments  myofacial ball release Lt sidelying ball at lateral border of scapula through the lats and teres pt rocking forward ~ 2 min small yellow ball      Shoulder Exercises: ROM/Strengthening   UBE (Upper Arm Bike) L4 x 4 min 2 min fwd/2 min back      Shoulder Exercises: Stretch   External Rotation Stretch 3  reps;60 seconds   pt standing Lt UE resting on doorway using strap to stabalize as pt stepped into door   Other Shoulder Stretches doorway stretch 3 positions 30 sec hold x 2 reps each    Other Shoulder Stretches shoulder flexion stepping under dowel in doorway 30 sec x 2 reps      Moist Heat Therapy   Number Minutes Moist Heat 10 Minutes    Moist Heat Location Shoulder      Manual Therapy   Joint Mobilization grade II to III inferior and post GH mobs    Soft tissue mobilization deep tissue work through the [employer] - anterior pecs/deltiod/biceps; lateral scapular area through teres/lats; upper trap    Myofascial Release anterior chest    Passive ROM Lt shoulder flexion with disassociation of scapular and humerus; ER; horizontal abduction; horizontal abduction with gentle ER                          PT Long Term Goals - 10/09/21 0733       PT LONG TERM GOAL #1   Title Pt will be independent with HEP    Time 6    Period Weeks    Status Revised    Target Date  11/20/21      PT LONG TERM GOAL #2   Title Pt will demo R = L shoulder ROM with </=1/10 pain    Time 6    Period Weeks    Status Revised    Target Date 11/20/21      PT LONG TERM GOAL #3   Title Pt will demo at least 4/5 posterior shoulder strength    Time 6    Period Weeks    Status Revised    Target Date 11/20/21      PT LONG TERM GOAL #4   Title Pt will be able to lift at least 5# overhead to reach in kitchen cabinets    Time 6    Period Weeks    Status Revised    Target Date 11/20/21                   Plan - 10/09/21 [id number]     Clinical Impression Statement Patient is exercising about every other day. Discussed importance of stretching to end range every day 2-3 times/day to change scar tissue and joint capsule tightness. Patient remains tight in end ranges Lt shoulder extension; ER; horizontal abduction and those combinations with passive stretch. Demonstrates increased shoulder flexion post stretching into limited planes. Patient has significant capsular tightness.    Rehab Potential Good    PT Frequency 2x / week    PT Duration 6 weeks    PT Treatment/Interventions ADLs/Self Care Home Management;Cryotherapy;Electrical Stimulation;Iontophoresis 4mg /ml Dexamethasone;Moist Heat;DME Instruction;Functional mobility training;Therapeutic exercise;Therapeutic activities;Neuromuscular re-education;Manual techniques;Patient/family education;Passive range of motion;Dry needling;Taping    PT Next Visit Plan Continue DN and progress HEP. Stretch joint, pecs and lats. Scapular mobilizations and stability. Work on posterior shoulder girdle strengthening    PT Home Exercise Plan [email or web address]    Consulted and Agree with Plan of Care Patient             Patient will benefit from skilled therapeutic intervention in order to improve the following deficits and impairments:     Visit Diagnosis: Chronic left shoulder pain  Stiffness of left shoulder, not elsewhere  classified  Muscle weakness (generalized)     Problem List Patient Active Problem List  Diagnosis Date Noted   Well adult exam 08/16/2021   Labral tear of shoulder 07/17/2021   Type 2 diabetes mellitus without complications (HCC) 09/09/2017   Achilles tendonitis, bilateral 09/13/2016   Absence of kidney 07/22/2014   Dyslipidemia 07/22/2014   Acid reflux 07/22/2014   Avitaminosis D 07/22/2014    Deven Furia Rober Minion, PT, MPH  10/09/2021, 9:44 AM  [facility] [address] [place], [place], [address] Phone: [phone]   Fax:  [phone]  Name: Darius Roberts MRN: [id number] Date of Birth: [birth year]

## 2021-10-11 ENCOUNTER — Ambulatory Visit: Payer: 59 | Admitting: Rehabilitative and Restorative Service Providers"

## 2021-10-11 DIAGNOSIS — M6281 Muscle weakness (generalized): Secondary | ICD-10-CM

## 2021-10-11 DIAGNOSIS — M25512 Pain in left shoulder: Secondary | ICD-10-CM | POA: Diagnosis not present

## 2021-10-11 DIAGNOSIS — G8929 Other chronic pain: Secondary | ICD-10-CM

## 2021-10-11 DIAGNOSIS — M25612 Stiffness of left shoulder, not elsewhere classified: Secondary | ICD-10-CM

## 2021-10-11 NOTE — Therapy (Addendum)
[facility] [place] [address] [place], [place], [address] Phone: [phone]   Fax:  [phone]  Physical Therapy Treatment Rationale for Evaluation and Treatment Rehabilitation  Patient Details  Name: Darius Roberts MRN: [id number] Date of Birth: 1965/10/30 Referring Provider (PT): Darius Broom, MD   Encounter Date: 10/11/2021   PT End of Session - 10/11/21 0721     Visit Number 11    Number of Visits 20    Date for PT Re-Evaluation 11/20/21    Authorization Type UHC    PT Start Time 0720    PT Stop Time 0800    PT Time Calculation (min) 40 min    Activity Tolerance Patient tolerated treatment well             Past Medical History:  Diagnosis Date   Absence of kidney 07/22/2014   Overview:  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   Subjective Assessment - 10/11/21 0722     Subjective Patient reports that he was really sore after last treatment and still has some soreness in the Lt shoulder. Darius Roberts has an appointment with Darius Roberts today. Darius Roberts has continued pain in the Lt shoulder which has not changed.    Currently in Pain? 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    Referring Provider (PT) Darius Broom, MD    Onset Date/Surgical Date 02/24/19    Hand Dominance Right    Next MD Visit 10/11/21    Prior Therapy for L shoulder x 6 months      AROM   Left Shoulder Extension 55 Degrees    Left Shoulder Flexion 149 Degrees    Left Shoulder ABduction 153 Degrees    Left Shoulder Internal Rotation --   thumb T10 painful   Left Shoulder External Rotation 70 Degrees   shoulder 90 degabduction/elbow 90 deg     PROM   Right Shoulder Extension 84 Degrees    Left Shoulder Extension 70 Degrees      Strength   Overall Strength Comments weak posterior shoulder girdle    Left Shoulder Flexion 5/5    Left Shoulder Extension 5/5    Left Shoulder ABduction 5/5    Left Shoulder Internal Rotation 5/5    Left Shoulder External Rotation 5/5    Left Shoulder Horizontal ABduction 5/5    Left Shoulder Horizontal ADduction 5/5  [facility] Adult PT Treatment/Exercise - 10/11/21 0001       Self-Care   Other Self-Care Comments  myofacial ball release Lt sidelying ball at lateral border of scapula through the lats and teres pt rocking forward ~ 2 min small yellow ball      Shoulder Exercises: Supine   Other Supine Exercises trunk rotation supine 20-30 sec x 3 reps each side; lat stretch supine 30 sec hold x 3 reps      Shoulder Exercises: Sidelying   Other Sidelying Exercises retraction x10; scapular posterior tilting ("11 to 5" scapular clock) x10      Shoulder Exercises: Standing   Row Strengthening;20 reps;Theraband    Theraband Level (Shoulder Row) Level 4 (Blue)    Row Limitations bow and arrow green TB x 10 each side VC and tactile cues to engage posterior shoulder girdle    Other Standing Exercises Back against pool noodle red tband: shoulder ER 2x10, "W" 2x10, horizontal abd 2x10      Shoulder Exercises: Pulleys   Flexion 2 minutes    Flexion Limitations pause for  stretch end range ~ 10 sec    Scaption 2 minutes    Scaption Limitations pause for stretch end range 10 sec      Shoulder Exercises: ROM/Strengthening   UBE (Upper Arm Bike) L4 x 4 min 2 min fwd/2 min back      Shoulder Exercises: Stretch   External Rotation Stretch 3 reps;60 seconds   pt standing Lt UE resting on doorway using strap to stabalize as pt stepped into door   Other Shoulder Stretches doorway stretch 3 positions 30 sec hold x 2 reps each    Other Shoulder Stretches shoulder flexion stepping under dowel in doorway 30 sec x 2 reps                     PT Education - 10/11/21 0748     Education Details HEP    Person(s) Educated Patient    Methods Explanation;Demonstration;Tactile cues;Verbal cues;Handout    Comprehension Verbalized understanding;Returned demonstration;Verbal cues required;Tactile cues required                 PT Long Term Goals - 10/11/21 0733       PT LONG TERM GOAL #1   Title Pt will be independent with HEP    Time 6    Period Weeks    Status Achieved    Target Date 11/20/21      PT LONG TERM GOAL #2   Title Pt will demo R = L shoulder ROM with </=1/10 pain    Time 6    Period Weeks    Status On-going    Target Date 11/20/21      PT LONG TERM GOAL #3   Title Pt will demo at least 4/5 posterior shoulder strength    Time 6    Period Weeks    Status Achieved    Target Date 11/20/21      PT LONG TERM GOAL #4   Title Pt will be able to lift at least 5# overhead to reach in kitchen cabinets    Time 6    Period Weeks    Status On-going    Target Date 11/20/21                   Plan - 10/11/21 0726     Clinical Impression Statement Patient has been seen for 11 visits over the past 6  weeks to address chronic Lt shoulder pain following a fall ~ 2 and a half years ago. He has had continued symptoms over the years. Patient has worked on stretching and strengthening for Rt upper quarter. ROM continues to be limited at  end ranges in elevation and rotation as well as pain at end ranges. Patient has significant capsular tightness in Lt shoulder. He has increased pain in the Lt shoulder with PT efforts to stretch capsule. Capsular tightness has created poor scapular contol - dyskinesis. Goals of therapy have been partially accomplished but patient's shoulder remains limited and painful.    Rehab Potential Good    PT Frequency 2x / week    PT Duration 6 weeks    PT Treatment/Interventions ADLs/Self Care Home Management;Cryotherapy;Electrical Stimulation;Iontophoresis 4mg /ml Dexamethasone;Moist Heat;DME Instruction;Functional mobility training;Therapeutic exercise;Therapeutic activities;Neuromuscular re-education;Manual techniques;Patient/family education;Passive range of motion;Dry needling;Taping    PT Next Visit Plan Note to MD - hold PT    PT Home Exercise Plan [email or web address]    Consulted and Agree with Plan of Care Patient             Patient will benefit from skilled therapeutic intervention in order to improve the following deficits and impairments:     Visit Diagnosis: Chronic left shoulder pain  Stiffness of left shoulder, not elsewhere classified  Muscle weakness (generalized)     Problem List Patient Active Problem List   Diagnosis Date Noted   Well adult exam 08/16/2021   Labral tear of shoulder 07/17/2021   Type 2 diabetes mellitus without complications (HCC) 09/09/2017   Achilles tendonitis, bilateral 09/13/2016   Absence of kidney 07/22/2014   Dyslipidemia 07/22/2014   Acid reflux 07/22/2014   Avitaminosis D 07/22/2014    Laniesha Das 07/24/2014, PT, MPH  10/11/2021, 11:16 AM  [facility] [address] [place], [place], [place] Phone: [phone]   Fax:  [phone]  Name: Darius Roberts MRN: [name] Date of Birth: 09-03-1965

## 2021-10-16 ENCOUNTER — Ambulatory Visit: Payer: 59 | Admitting: Physical Therapy

## 2021-10-18 ENCOUNTER — Encounter: Payer: 59 | Admitting: Rehabilitative and Restorative Service Providers"

## 2021-10-23 ENCOUNTER — Encounter: Payer: 59 | Admitting: Rehabilitative and Restorative Service Providers"

## 2021-11-01 ENCOUNTER — Ambulatory Visit: Payer: 59 | Attending: Orthopedic Surgery | Admitting: Rehabilitative and Restorative Service Providers"

## 2021-11-01 ENCOUNTER — Other Ambulatory Visit: Payer: Self-pay

## 2021-11-01 ENCOUNTER — Encounter: Payer: Self-pay | Admitting: Rehabilitative and Restorative Service Providers"

## 2021-11-01 DIAGNOSIS — G8929 Other chronic pain: Secondary | ICD-10-CM | POA: Diagnosis present

## 2021-11-01 DIAGNOSIS — M25612 Stiffness of left shoulder, not elsewhere classified: Secondary | ICD-10-CM | POA: Insufficient documentation

## 2021-11-01 DIAGNOSIS — M25512 Pain in left shoulder: Secondary | ICD-10-CM | POA: Insufficient documentation

## 2021-11-01 DIAGNOSIS — M6281 Muscle weakness (generalized): Secondary | ICD-10-CM | POA: Diagnosis present

## 2021-11-01 NOTE — Therapy (Signed)
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)    Diabetes ([place]) 09/09/2017   Dyslipidemia 07/22/2014   GERD (gastroesophageal reflux disease)    Past Surgical History:  Procedure Laterality Date   KIDNEY DONATION Right    Patient Active Problem List   Diagnosis Date Noted   Well adult exam 08/16/2021   Labral tear of shoulder 07/17/2021   Type 2 diabetes mellitus without complications ([place]) [id number]   Achilles tendonitis, bilateral 09/13/2016   Absence of kidney 07/22/2014   Dyslipidemia 07/22/2014   Acid reflux 07/22/2014   Avitaminosis D 07/22/2014    PCP: Dr Tania Ade  REFERRING PROVIDER: Dr Luetta Nutting  REFERRING DIAG: Lt shoulder pain   THERAPY DIAG:  Chronic left shoulder pain  Stiffness of left shoulder, not elsewhere classified  Muscle weakness (generalized)  Rationale for Evaluation and Treatment Rehabilitation  ONSET DATE: MUA 10/31/21  SUBJECTIVE:                                                                                                                                                                                      SUBJECTIVE STATEMENT: Patient reports initial injury 2020 when he feel at work. He had continued pain and limited ROM. He had PT for ~ 6 months with no significant improvement. He received injections. He was seen by MD revferred for MRI which showed torn labrum. He underwent manipulation yesterday.   PERTINENT  HISTORY: See above. Patient also reports that he is pre-diabetic  PAIN:  Are you having pain? Yes: NPRS scale: 0/10 Pain location: shoulder  Pain description: none  Aggravating factors: lifting; using Relieving factors: ice  PRECAUTIONS: None  WEIGHT BEARING RESTRICTIONS No  FALLS:  Has patient fallen in last 6 months? 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 Moderate tendinosis of the supraspinatus and infraspinatus tendons. 2. Mild tendinosis of the intra-articular portion of the long head of the biceps tendon. 3. Superior labral tear extending into the posterior labrum.  PATIENT SURVEYS:  FOTO 4  COGNITION:  Overall cognitive status: Within functional limits for tasks assessed     SENSATION: WFL  POSTURE: Head forward; shoulders rounded and elevated; scapulae abducted and rotated along the thoracic wall   UPPER EXTREMITY ROM:   Passive ROM Right eval Left eval  Shoulder flexion 180 171  Shoulder extension  60  Shoulder abduction  170  Shoulder adduction    Shoulder internal rotation  35 in scapular plane  Shoulder external rotation  90 in scapular plane  Elbow flexion  WNL's  Elbow extension  WNL's   Wrist flexion  WNL's  Wrist extension  WNL's  Wrist ulnar deviation  WNL's  Wrist radial deviation  WNL's  Wrist pronation  WNL's  Wrist supination  WNL's  (Blank rows = not tested)  UPPER EXTREMITY MMT:  Not tested   MMT Right eval Left eval  Shoulder flexion    Shoulder extension    Shoulder abduction    Shoulder adduction    Shoulder internal rotation    Shoulder external rotation    Middle trapezius    Lower trapezius    Elbow flexion    Elbow extension    Wrist flexion     Wrist extension    Wrist ulnar deviation    Wrist radial deviation    Wrist pronation    Wrist supination    Grip strength (lbs)    (Blank rows = not tested)   PALPATION:  WFL's    TODAY'S TREATMENT:  Education re post op rehab and importance of supporting Lt UE until sensation has returned Education re use of ice and proper positioning to manage Lt UE  Wife, Darl Pikes, instructed in assisting pt with PROM Lt shoulder flexion and ER in scapular plane  Patient supine for PROM Lt shoulder in flexion; scaption; ER/IR in scapular plane; extension without tissue limits and normal ROM  Ice Lt shoulder x 10 min post treatment    PATIENT EDUCATION: Education details: POC; HEP; ICE Person educated: Patient and Spouse Education method: Programmer, multimedia, Facilities manager, Actor cues, and Verbal cues Education comprehension: verbalized understanding, returned demonstration, verbal cues required, tactile cues required, and needs further education   HOME EXERCISE PROGRAM: PROM by patient's wife   ASSESSMENT:  CLINICAL IMPRESSION: Patient is a 56 y.o. male who was seen today for physical therapy evaluation and treatment for chronic Lt shoulder pain following injury in 2020resulting from a fall. He has had two courses of PT with some improvement in ROM and strength but he continued to have pain in the Lt shoulder as well as scapular dyskinesis and decreased functional use of Lt UE.  Patient presents with Lt UE in sling with decreased sensation and motor function from anesthesia. He has good Lt shoulder ROM but has continued limitations in end range shoulder flexion and extension compared to Rt UE. Patient will benefit from PT to address problems and return Lt UE to normal functional activities with minimal pain.     OBJECTIVE IMPAIRMENTS decreased activity tolerance, decreased mobility, decreased ROM, decreased strength, hypomobility, impaired flexibility, impaired UE functional use, improper body  mechanics, postural dysfunction, and pain.   ACTIVITY LIMITATIONS carrying, lifting, bathing, toileting, dressing, reach over head, and hygiene/grooming  PARTICIPATION LIMITATIONS: meal prep, cleaning, driving,  occupation, and yard work  PERSONAL FACTORS Past/current experiences and Time since onset of injury/illness/exacerbation are also affecting patient's functional outcome.   REHAB POTENTIAL: Good  CLINICAL DECISION MAKING: Stable/uncomplicated  EVALUATION COMPLEXITY: Low   GOALS: Goals reviewed with patient? Yes   LONG TERM GOALS: Target date: 12/27/2021  (Remove Blue Hyperlink)  Patient demonstrates full PROM Lt shoulder  Baseline:  Goal status: INITIAL  2.  Patient demonstrates AROM Lt shoulder to ~ equal to AROM Rt shoulder - with good scapular control  Baseline:  Goal status: INITIAL  3.  5/5 strength Rt shoulder  Baseline:  Goal status: INITIAL  4.  Patient reports return normal functional activities with minimal to no Lt shoulder pain  Baseline:  Goal status: INITIAL  5.  Independent in HEP  Baseline:  Goal status: INITIAL  6.  Improve functional limitation score to 59 Baseline:  Goal status: INITIAL   PLAN: PT FREQUENCY: 3x/week  PT DURATION: 6 weeks  PLANNED INTERVENTIONS: Therapeutic exercises, Therapeutic activity, Neuromuscular re-education, Patient/Family education, Self Care, Joint mobilization, Aquatic Therapy, Dry Needling, Electrical stimulation, Cryotherapy, Moist heat, Taping, Vasopneumatic device, Ultrasound, Manual therapy, and Re-evaluation  PLAN FOR NEXT SESSION: Review and progress HEP, progressing with patient doing AAROM to PROM progressing to active exercise and resistive exercises as tolerated. Focus on ROM post MUA Lt shoulder 10/31/21. Manual work and modalities as indicated.    Val Riles, PT, MPH  11/01/2021, 1:44 PM

## 2021-11-03 ENCOUNTER — Ambulatory Visit: Payer: 59 | Admitting: Physical Therapy

## 2021-11-03 ENCOUNTER — Encounter: Payer: Self-pay | Admitting: Physical Therapy

## 2021-11-03 DIAGNOSIS — M6281 Muscle weakness (generalized): Secondary | ICD-10-CM

## 2021-11-03 DIAGNOSIS — M25512 Pain in left shoulder: Secondary | ICD-10-CM | POA: Diagnosis not present

## 2021-11-03 DIAGNOSIS — M25612 Stiffness of left shoulder, not elsewhere classified: Secondary | ICD-10-CM

## 2021-11-03 DIAGNOSIS — G8929 Other chronic pain: Secondary | ICD-10-CM

## 2021-11-03 NOTE — Therapy (Signed)
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)    Diabetes (HCC) 09/09/2017   Dyslipidemia 07/22/2014   GERD (gastroesophageal reflux disease)    Past Surgical History:  Procedure Laterality Date   KIDNEY DONATION Right    Patient Active Problem List   Diagnosis Date Noted   Well adult exam 08/16/2021   Labral tear of shoulder 07/17/2021   Type 2 diabetes mellitus without complications (HCC) 09/09/2017   Achilles tendonitis, bilateral 09/13/2016   Absence of kidney 07/22/2014   Dyslipidemia 07/22/2014   Acid reflux 07/22/2014   Avitaminosis D 07/22/2014    PCP: Dr Jones Broom  REFERRING PROVIDER: Dr Everrett Coombe  REFERRING DIAG: Lt shoulder pain   THERAPY DIAG:  Chronic left shoulder pain  Stiffness of left shoulder, not elsewhere classified  Muscle weakness (generalized)  Rationale for Evaluation and Treatment Rehabilitation  ONSET DATE: MUA 10/31/21  SUBJECTIVE:                                                                                                                                                                                      SUBJECTIVE STATEMENT: Pt states that more muscles are waking up.   PERTINENT HISTORY: See above. Patient also reports that he is pre-diabetic  PAIN:  Are you having pain? Yes: NPRS scale: 0/10 Pain location: shoulder  Pain description: none  Aggravating factors: lifting; using Relieving factors: ice  PRECAUTIONS:  None  WEIGHT BEARING RESTRICTIONS No  FALLS:  Has patient fallen in last 6 months? 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 Moderate tendinosis of the supraspinatus and infraspinatus tendons. 2. Mild tendinosis of the intra-articular portion of the long head of the biceps tendon. 3. Superior labral tear extending into the posterior labrum.  PATIENT SURVEYS:  FOTO 4  COGNITION:  Overall cognitive status: Within functional limits for tasks assessed     SENSATION: WFL  POSTURE: Head forward; shoulders rounded and elevated; scapulae abducted and rotated along the thoracic wall   UPPER EXTREMITY ROM:   Passive ROM Right eval Left eval  Shoulder flexion 180 171  Shoulder extension  60  Shoulder abduction  170  Shoulder adduction    Shoulder internal rotation  35 in scapular plane  Shoulder external rotation  90 in scapular plane  Elbow flexion  WNL's  Elbow extension  WNL's   Wrist flexion  WNL's  Wrist extension  WNL's  Wrist ulnar deviation  WNL's  Wrist radial deviation  WNL's  Wrist pronation  WNL's  Wrist supination  WNL's  (Blank rows = not tested)  UPPER EXTREMITY MMT:  Not tested   MMT Right eval Left eval  Shoulder flexion    Shoulder extension    Shoulder abduction    Shoulder adduction    Shoulder internal rotation    Shoulder external rotation    Middle trapezius    Lower trapezius    Elbow flexion    Elbow extension    Wrist flexion    Wrist extension    Wrist ulnar deviation    Wrist radial deviation    Wrist pronation    Wrist supination    Grip strength (lbs)    (Blank rows = not tested)   PALPATION:  WFL's    TODAY'S TREATMENT:  11/03/21 Manual therapy: Shoulder  PROM all directions supine and sidelying Scapular mobilizations/PROM all directions  Reviewed PROM with pt's wife for shoulder flex and ER in scapular plane  Therex: Pendulums forward/back x10 Pendulums laterally x10 Pendulum CW & CCW x10  11/01/21 Education re post op rehab and importance of supporting Lt UE until sensation has returned Education re use of ice and proper positioning to manage Lt UE  Wife, Darius Roberts, instructed in assisting pt with PROM Lt shoulder flexion and ER in scapular plane  Patient supine for PROM Lt shoulder in flexion; scaption; ER/IR in scapular plane; extension without tissue limits and normal ROM  Ice Lt shoulder x 10 min post treatment    PATIENT EDUCATION: Education details: POC; HEP; ICE Person educated: Patient and Spouse Education method: Explanation, Demonstration, Tactile cues, and Verbal cues Education comprehension: verbalized understanding, returned demonstration, verbal cues required, tactile cues required, and needs further education   HOME EXERCISE PROGRAM: PROM by patient's wife  Access Code: [email or web address] URL: [email or web address] Date: 11/03/2021 Prepared by: Vernon Prey April Kirstie Peri  Exercises - Flexion-Extension Shoulder Pendulum with Table Support  - 1 x daily - 7 x weekly - 2 sets - 10 reps - Horizontal Shoulder Pendulum with Table Support  - 1 x daily - 7 x weekly - 2 sets - 10 reps - Circular Shoulder Pendulum with Table Support  - 1 x daily - 7 x weekly - 2 sets - 10 reps  ASSESSMENT:  CLINICAL IMPRESSION: Session primarily focused on reviewing PROM with wife and continuing to perform shoulder PROM. Initiated pendulums this session. Pt has been performing AROM of elbow, hand and wrist.    OBJECTIVE IMPAIRMENTS decreased activity tolerance, decreased mobility, decreased ROM, decreased strength, hypomobility, impaired flexibility, impaired UE functional use, improper body mechanics, postural dysfunction, and pain.    ACTIVITY LIMITATIONS carrying, lifting, bathing, toileting, dressing, reach over head, and hygiene/grooming  PARTICIPATION LIMITATIONS: meal prep, cleaning, driving, occupation, and yard work  PERSONAL FACTORS Past/current  experiences and Time since onset of injury/illness/exacerbation are also affecting patient's functional outcome.   REHAB POTENTIAL: Good  CLINICAL DECISION MAKING: Stable/uncomplicated  EVALUATION COMPLEXITY: Low   GOALS: Goals reviewed with patient? Yes   LONG TERM GOALS: Target date: 12/29/2021  (Remove Blue Hyperlink)  Patient demonstrates full PROM Lt shoulder  Baseline:  Goal status: INITIAL  2.  Patient demonstrates AROM Lt shoulder to ~ equal to AROM Rt shoulder - with good scapular control  Baseline:  Goal status: INITIAL  3.  5/5 strength Rt shoulder  Baseline:  Goal status: INITIAL  4.  Patient reports return normal functional activities with minimal to no Lt shoulder pain  Baseline:  Goal status: INITIAL  5.  Independent in HEP  Baseline:  Goal status: INITIAL  6.  Improve functional limitation score to 59 Baseline:  Goal status: INITIAL   PLAN: PT FREQUENCY: 3x/week  PT DURATION: 6 weeks  PLANNED INTERVENTIONS: Therapeutic exercises, Therapeutic activity, Neuromuscular re-education, Patient/Family education, Self Care, Joint mobilization, Aquatic Therapy, Dry Needling, Electrical stimulation, Cryotherapy, Moist heat, Taping, Vasopneumatic device, Ultrasound, Manual therapy, and Re-evaluation  PLAN FOR NEXT SESSION: Review and progress HEP, progressing with patient doing AAROM to PROM progressing to active exercise and resistive exercises as tolerated. Focus on ROM post MUA Lt shoulder 10/31/21. Manual work and modalities as indicated.    Brentt Fread April Ma L Shyvonne Chastang, PT, DPT  11/03/2021, 10:18 AM

## 2021-11-06 ENCOUNTER — Ambulatory Visit: Payer: 59 | Admitting: Rehabilitative and Restorative Service Providers"

## 2021-11-06 DIAGNOSIS — G8929 Other chronic pain: Secondary | ICD-10-CM

## 2021-11-06 DIAGNOSIS — M25512 Pain in left shoulder: Secondary | ICD-10-CM | POA: Diagnosis not present

## 2021-11-06 DIAGNOSIS — M25612 Stiffness of left shoulder, not elsewhere classified: Secondary | ICD-10-CM

## 2021-11-06 DIAGNOSIS — M6281 Muscle weakness (generalized): Secondary | ICD-10-CM

## 2021-11-06 NOTE — Therapy (Signed)
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)    Diabetes (HCC) 09/09/2017   Dyslipidemia 07/22/2014   GERD (gastroesophageal reflux disease)    Past Surgical History:  Procedure Laterality Date   KIDNEY DONATION Right    Patient Active Problem List   Diagnosis Date Noted   Well adult exam 08/16/2021   Labral tear of shoulder 07/17/2021   Type 2 diabetes mellitus without complications (HCC) 09/09/2017   Achilles tendonitis, bilateral 09/13/2016   Absence of kidney 07/22/2014   Dyslipidemia 07/22/2014   Acid reflux 07/22/2014   Avitaminosis D 07/22/2014    PCP: Dr Jones Broom  REFERRING PROVIDER: Dr Everrett Coombe  REFERRING DIAG: Lt shoulder pain   THERAPY DIAG:  Chronic left shoulder pain  Stiffness of left shoulder, not elsewhere classified  Muscle weakness (generalized)  Rationale for Evaluation and Treatment Rehabilitation  ONSET DATE: MUA 10/31/21  SUBJECTIVE:                                                                                                                                                                                      SUBJECTIVE STATEMENT:  Nerve block still working - has more feeling but no real pain. Has some soreness but no pain. /he has weaned from the sling. Sleeping on his side with Lt UE supported. Working on exercises at home. Plans to return to work tomorrow.   PERTINENT HISTORY: See above. Patient also reports that he is  pre-diabetic  PAIN:  Are you having pain? Yes: NPRS scale: 0/10 Pain location: shoulder  Pain description: none  Aggravating factors: lifting; using Relieving factors: ice  PRECAUTIONS: None  WEIGHT BEARING RESTRICTIONS No  FALLS:  Has patient fallen in last 6 months? 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 Moderate tendinosis of the supraspinatus and infraspinatus tendons. 2. Mild tendinosis of the intra-articular portion of the long head of the biceps tendon. 3. Superior labral tear extending into the posterior labrum.  PATIENT SURVEYS:  FOTO 4  COGNITION:  Overall cognitive status: Within functional limits for tasks assessed     SENSATION: WFL  POSTURE: Head forward; shoulders rounded and elevated; scapulae abducted and rotated along the thoracic wall   UPPER EXTREMITY ROM:   Passive ROM Right eval Left eval  Shoulder flexion 180 171  Shoulder extension  60  Shoulder abduction  170  Shoulder adduction    Shoulder internal rotation  35 in scapular plane  Shoulder external rotation  90 in scapular plane  Elbow flexion  WNL's  Elbow extension  WNL's   Wrist flexion  WNL's  Wrist extension  WNL's  Wrist ulnar deviation  WNL's  Wrist radial deviation  WNL's  Wrist pronation  WNL's  Wrist supination  WNL's  (Blank rows = not tested)  UPPER EXTREMITY MMT:  Not tested   MMT Right eval Left eval  Shoulder flexion    Shoulder extension    Shoulder abduction    Shoulder adduction    Shoulder internal rotation    Shoulder external rotation    Middle trapezius    Lower trapezius    Elbow flexion    Elbow extension    Wrist flexion    Wrist extension    Wrist ulnar deviation    Wrist  radial deviation    Wrist pronation    Wrist supination    Grip strength (lbs)    (Blank rows = not tested)   PALPATION:  WFL's    TODAY'S TREATMENT:  11/06/21 Manual therapy: STM Lt shoulder girdle Shoulder PROM all planes supine and sidelying Scapular mobilizations/PROM all directions  Therex: Pulley - flexion 10 sec x 10  Pulley scaption 10 x 10 Pulley horiz ab/add in mid range Wall slide x 10  Doorway stretch 3 positions 3 reps x 30 sec hold ER with cane AAROM 10 sec x 10 reps  Row green TB x 10 reps     11/01/21 Education re post op rehab and importance of supporting Lt UE until sensation has returned Education re use of ice and proper positioning to manage Lt UE  Wife, Darl Pikes, instructed in assisting pt with PROM Lt shoulder flexion and ER in scapular plane  Patient supine for PROM Lt shoulder in flexion; scaption; ER/IR in scapular plane; extension without tissue limits and normal ROM  Ice Lt shoulder x 10 min post treatment    PATIENT EDUCATION: Education details: POC; HEP; ICE Person educated: Patient and Spouse Education method: Explanation, Demonstration, Tactile cues, and Verbal cues Education comprehension: verbalized understanding, returned demonstration, verbal cues required, tactile cues required, and needs further education   HOME EXERCISE PROGRAM: Access Code: [email or web address] URL: [email or web address] Date: 11/06/2021 Prepared by: Corlis Leak  Exercises - Flexion-Extension Shoulder Pendulum with Table Support  - 1 x daily - 7 x weekly - 2 sets - 10 reps - Horizontal Shoulder Pendulum with Table Support  - 1 x daily - 7 x weekly - 2 sets - 10 reps - Circular Shoulder Pendulum with Table Support  - 1 x daily - 7 x weekly - 2 sets - 10 reps - Doorway Pec Stretch at 60 Degrees Abduction  - 3 x daily - 7 x weekly - 1 sets - 3 reps - Doorway Pec Stretch at 90 Degrees Abduction  -  3 x daily - 7 x weekly - 1 sets - 3 reps - 30 seconds  hold -  Doorway Pec Stretch at 120 Degrees Abduction  - 3 x daily - 7 x weekly - 1 sets - 3 reps - 30 second hold  hold - Seated Shoulder External Rotation AAROM with Cane and Hand in Neutral  - 2 x daily - 7 x weekly - 1 sets - 5-10 reps - 5-10 sec  hold - Standing Bilateral Low Shoulder Row with Anchored Resistance  - 2 x daily - 7 x weekly - 1-3 sets - 10 reps - 2-3 sec  hold  ASSESSMENT:  CLINICAL IMPRESSION: Good progress post MUA and Lt shoulder scope. Continued with exercise with focus on ROM and again reviewing PROM for home. Added pulley and wall slide, AAROM with cane. Added resistive exercise    OBJECTIVE IMPAIRMENTS decreased activity tolerance, decreased mobility, decreased ROM, decreased strength, hypomobility, impaired flexibility, impaired UE functional use, improper body mechanics, postural dysfunction, and pain.   ACTIVITY LIMITATIONS carrying, lifting, bathing, toileting, dressing, reach over head, and hygiene/grooming  PARTICIPATION LIMITATIONS: meal prep, cleaning, driving, occupation, and yard work  PERSONAL FACTORS Past/current experiences and Time since onset of injury/illness/exacerbation are also affecting patient's functional outcome.   REHAB POTENTIAL: Good  CLINICAL DECISION MAKING: Stable/uncomplicated  EVALUATION COMPLEXITY: Low   GOALS: Goals reviewed with patient? Yes   LONG TERM GOALS: Target date: 01/01/2022  (Remove Blue Hyperlink)  Patient demonstrates full PROM Lt shoulder  Baseline:  Goal status: INITIAL  2.  Patient demonstrates AROM Lt shoulder to ~ equal to AROM Rt shoulder - with good scapular control  Baseline:  Goal status: INITIAL  3.  5/5 strength Rt shoulder  Baseline:  Goal status: INITIAL  4.  Patient reports return normal functional activities with minimal to no Lt shoulder pain  Baseline:  Goal status: INITIAL  5.  Independent in HEP  Baseline:  Goal status: INITIAL  6.  Improve functional limitation score to 59 Baseline:   Goal status: INITIAL   PLAN: PT FREQUENCY: 3x/week  PT DURATION: 6 weeks  PLANNED INTERVENTIONS: Therapeutic exercises, Therapeutic activity, Neuromuscular re-education, Patient/Family education, Self Care, Joint mobilization, Aquatic Therapy, Dry Needling, Electrical stimulation, Cryotherapy, Moist heat, Taping, Vasopneumatic device, Ultrasound, Manual therapy, and Re-evaluation  PLAN FOR NEXT SESSION: Review and progress HEP, progressing with patient doing AAROM to PROM progressing to active exercise and resistive exercises as tolerated. Focus on ROM post MUA Lt shoulder 10/31/21. Manual work and modalities as indicated.    Val Riles, PT, MPH  11/06/2021, 8:50 AM

## 2021-11-06 NOTE — Patient Instructions (Signed)
[email or web address]

## 2021-11-07 ENCOUNTER — Ambulatory Visit: Payer: 59 | Admitting: Rehabilitative and Restorative Service Providers"

## 2021-11-07 ENCOUNTER — Encounter: Payer: Self-pay | Admitting: Rehabilitative and Restorative Service Providers"

## 2021-11-07 ENCOUNTER — Other Ambulatory Visit: Payer: Self-pay

## 2021-11-07 DIAGNOSIS — M25512 Pain in left shoulder: Secondary | ICD-10-CM | POA: Diagnosis not present

## 2021-11-07 DIAGNOSIS — G8929 Other chronic pain: Secondary | ICD-10-CM

## 2021-11-07 DIAGNOSIS — M25612 Stiffness of left shoulder, not elsewhere classified: Secondary | ICD-10-CM

## 2021-11-07 DIAGNOSIS — M6281 Muscle weakness (generalized): Secondary | ICD-10-CM

## 2021-11-07 NOTE — Therapy (Signed)
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)    Diabetes (HCC) 09/09/2017   Dyslipidemia 07/22/2014   GERD (gastroesophageal reflux disease)    Past Surgical History:  Procedure Laterality Date   KIDNEY DONATION Right    Patient Active Problem List   Diagnosis Date Noted   Well adult exam 08/16/2021   Labral tear of shoulder 07/17/2021   Type 2 diabetes mellitus without complications (HCC) 09/09/2017   Achilles tendonitis, bilateral 09/13/2016   Absence of kidney 07/22/2014   Dyslipidemia 07/22/2014   Acid reflux 07/22/2014   Avitaminosis D 07/22/2014    PCP: Dr Jones Broom  REFERRING PROVIDER: Dr Everrett Coombe  REFERRING DIAG: Lt shoulder pain   THERAPY DIAG:  Chronic left shoulder pain  Stiffness of left shoulder, not elsewhere classified  Muscle weakness (generalized)  Rationale for Evaluation and Treatment Rehabilitation  ONSET DATE: MUA 10/31/21  SUBJECTIVE:                                                                                                                                                                                      SUBJECTIVE STATEMENT: Has some soreness but no pain. No longer using the sling. Returns to work today.  PERTINENT HISTORY: See above. Patient also reports that he is pre-diabetic  PAIN:  Are you having pain? Yes: NPRS scale: 0/10 Pain location: shoulder  Pain description: none  Aggravating factors: lifting;  using Relieving factors: ice  PRECAUTIONS: None  WEIGHT BEARING RESTRICTIONS No  FALLS:  Has patient fallen in last 6 months? 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 Moderate tendinosis of the supraspinatus and infraspinatus tendons. 2. Mild tendinosis of the intra-articular portion of the long head of the biceps tendon. 3. Superior labral tear extending  into the posterior labrum.  PATIENT SURVEYS:  FOTO 4  COGNITION:  Overall cognitive status: Within functional limits for tasks assessed     SENSATION: WFL  POSTURE: Head forward; shoulders rounded and elevated; scapulae abducted and rotated along the thoracic wall   UPPER EXTREMITY ROM:   Passive ROM Right eval  Left eval Left  11/07/21  Shoulder flexion 180  171 175  Shoulder extension   60 70  Shoulder abduction   170 180  Shoulder adduction      Shoulder internal rotation   35 in scapular plane 67 in scapular plane   Shoulder external rotation   90 in scapular plane 95 in scapular plane   Elbow flexion   WNL's   Elbow extension   WNL's    Wrist flexion   WNL's   Wrist extension   WNL's   Wrist ulnar deviation   WNL's   Wrist radial deviation   WNL's   Wrist pronation   WNL's   Wrist supination   WNL's   (Blank rows = not tested)     PALPATION:  WFL's    TODAY'S TREATMENT:  11/07/21 Manual therapy: STM Lt shoulder girdle Shoulder PROM all planes supine and sidelying Scapular mobilizations/PROM all directions  Therex: Pulley - flexion 10 sec x 10  Pulley scaption 10 x 10 Pulley horiz ab/add in mid range Wall slide x 10  Doorway stretch 3 positions 30 sec x 3 each  Shoulder flexion through doorway 30 sec x 2  Biceps stretch 30 sec x  2 reps  ER with cane AAROM 10 sec x 10 reps  Row green TB x 20 reps  Bow and arrow blue TB x 10 each side External rotation green TB Rt x 10 Internal rotation green TB Rt x 10  Modalities: Ice pack x 10 min   PATIENT EDUCATION: Education details: POC; HEP; ICE Person educated: Patient and Spouse Education method: Explanation, Demonstration, Tactile cues, and Verbal cues Education comprehension: verbalized understanding, returned demonstration, verbal cues required, tactile cues required, and needs further education   HOME EXERCISE PROGRAM:   ASSESSMENT:  CLINICAL IMPRESSION: Good progress post MUA and Lt shoulder scope. Continued with exercise with focus on ROM and gradually adding resistive exercises.     OBJECTIVE IMPAIRMENTS decreased activity tolerance, decreased mobility, decreased ROM, decreased strength, hypomobility, impaired flexibility, impaired UE functional use, improper body mechanics, postural dysfunction, and pain.   ACTIVITY LIMITATIONS carrying, lifting, bathing, toileting, dressing, reach over head, and hygiene/grooming  PARTICIPATION LIMITATIONS: meal prep, cleaning, driving, occupation, and yard work  PERSONAL FACTORS Past/current experiences and Time since onset of injury/illness/exacerbation are also affecting patient's functional outcome.   REHAB POTENTIAL: Good  CLINICAL DECISION MAKING: Stable/uncomplicated  EVALUATION COMPLEXITY: Low   GOALS: Goals reviewed with patient? Yes   LONG TERM GOALS: Target date: 01/02/2022  (Remove Blue Hyperlink)  Patient demonstrates full PROM Lt shoulder  Baseline:  Goal status: INITIAL  2.  Patient demonstrates AROM Lt shoulder to ~ equal to AROM Rt shoulder - with good scapular control  Baseline:  Goal status: INITIAL  3.  5/5 strength Rt shoulder  Baseline:  Goal status: INITIAL  4.  Patient reports return normal functional activities with minimal to no Lt shoulder pain  Baseline:  Goal status:  INITIAL  5.  Independent in HEP  Baseline:  Goal status: INITIAL  6.  Improve functional limitation score to 59 Baseline:  Goal status: INITIAL   PLAN: PT FREQUENCY: 3x/week  PT  DURATION: 6 weeks  PLANNED INTERVENTIONS: Therapeutic exercises, Therapeutic activity, Neuromuscular re-education, Patient/Family education, Self Care, Joint mobilization, Aquatic Therapy, Dry Needling, Electrical stimulation, Cryotherapy, Moist heat, Taping, Vasopneumatic device, Ultrasound, Manual therapy, and Re-evaluation  PLAN FOR NEXT SESSION: Review and progress HEP, progressing with patient doing AAROM to PROM progressing to active exercise and resistive exercises as tolerated. Focus on ROM post MUA Lt shoulder 10/31/21. Progressing with strengthening and stabilization as tolerated. Manual work and modalities as indicated.    Val Riles, PT, MPH  11/07/2021, 8:45 AM

## 2021-11-10 ENCOUNTER — Encounter: Payer: Self-pay | Admitting: Physical Therapy

## 2021-11-10 ENCOUNTER — Ambulatory Visit: Payer: 59 | Admitting: Physical Therapy

## 2021-11-10 DIAGNOSIS — M25612 Stiffness of left shoulder, not elsewhere classified: Secondary | ICD-10-CM

## 2021-11-10 DIAGNOSIS — G8929 Other chronic pain: Secondary | ICD-10-CM

## 2021-11-10 DIAGNOSIS — M25512 Pain in left shoulder: Secondary | ICD-10-CM | POA: Diagnosis not present

## 2021-11-10 DIAGNOSIS — M6281 Muscle weakness (generalized): Secondary | ICD-10-CM

## 2021-11-10 NOTE — Therapy (Signed)
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)    Diabetes (HCC) 09/09/2017   Dyslipidemia 07/22/2014   GERD (gastroesophageal reflux disease)    Past Surgical History:  Procedure Laterality Date   KIDNEY DONATION Right    Patient Active Problem List   Diagnosis Date Noted   Well adult exam 08/16/2021   Labral tear of shoulder 07/17/2021   Type 2 diabetes mellitus without complications (HCC) 09/09/2017   Achilles tendonitis, bilateral 09/13/2016   Absence of kidney 07/22/2014   Dyslipidemia 07/22/2014   Acid reflux 07/22/2014   Avitaminosis D 07/22/2014    PCP: Dr Jones Broom  REFERRING PROVIDER: Dr Everrett Coombe  REFERRING DIAG: Lt shoulder pain   THERAPY DIAG:  Chronic left shoulder pain  Stiffness of left shoulder, not elsewhere classified  Muscle weakness (generalized)  Rationale for Evaluation and Treatment Rehabilitation  ONSET DATE: MUA 10/31/21  SUBJECTIVE:                                                                                                                                                                                      SUBJECTIVE STATEMENT: Pt states he ordered 2 pulleys but does not like any of them. He is searching for some good pulleys. He states he has been sore but not really in pain  PERTINENT HISTORY: See above. Patient also reports that he is pre-diabetic  PAIN:  Are you having pain? Yes: NPRS scale: 0/10 Pain location: shoulder  Pain description: none  Aggravating factors: lifting; using Relieving factors: ice    PATIENT GOALS use Lt UE normally again   OBJECTIVE:     UPPER EXTREMITY ROM:   Passive ROM Right eval  Left eval Left  11/07/21  Shoulder flexion 180  171 175  Shoulder extension   60 70   Shoulder abduction   170 180  Shoulder adduction      Shoulder internal rotation   35 in scapular plane 67 in scapular plane   Shoulder external rotation   90 in scapular plane 95 in scapular plane   Elbow flexion   WNL's   Elbow extension   WNL's    Wrist flexion   WNL's   Wrist extension   WNL's   Wrist ulnar deviation   WNL's   Wrist radial deviation   WNL's   Wrist pronation   WNL's   Wrist supination   WNL's   (Blank rows = not tested)     PALPATION:  WFL's    TODAY'S TREATMENT:  11/10/21 Therex: Pulley - flexion 10 sec x 10  Pulley scaption 10 x 10 Pulley horiz ab/add in mid range Doorway stretch 3 positions x 30 each Row blue TB x 20 Bow and arrow blue TB x 10 each side ER green TB x 10 IR green TB x 10 Supine diagonals red TB x 10  Manual: PROM shoulder and scapula all planes supine and sidelying   11/07/21 Manual therapy: STM Lt shoulder girdle Shoulder PROM all planes supine and sidelying Scapular mobilizations/PROM all directions  Therex: Pulley - flexion 10 sec x 10  Pulley scaption 10 x 10 Pulley horiz ab/add in mid range Wall slide x 10  Doorway stretch 3 positions 30 sec x 3 each  Shoulder flexion through doorway 30 sec x 2  Biceps stretch 30 sec x 2 reps  ER with cane AAROM 10 sec x 10 reps  Row green TB x 20 reps  Bow and arrow blue TB x 10 each side External rotation green TB Rt x 10 Internal rotation green TB Rt x 10  Modalities: Ice pack x 10 min   PATIENT EDUCATION: Education details: POC; HEP; ICE Person educated: Patient and Spouse Education method: Explanation, Demonstration, Tactile cues, and Verbal cues Education comprehension: verbalized understanding, returned demonstration, verbal cues required, tactile cues required, and needs further education   HOME EXERCISE PROGRAM: Access Code: [email or web address] URL: [email or web address] Date: 11/10/2021 Prepared by: Reggy Eye  Exercises - Flexion-Extension  Shoulder Pendulum with Table Support  - 1 x daily - 7 x weekly - 2 sets - 10 reps - Horizontal Shoulder Pendulum with Table Support  - 1 x daily - 7 x weekly - 2 sets - 10 reps - Circular Shoulder Pendulum with Table Support  - 1 x daily - 7 x weekly - 2 sets - 10 reps - Doorway Pec Stretch at 60 Degrees Abduction  - 3 x daily - 7 x weekly - 1 sets - 3 reps - Doorway Pec Stretch at 90 Degrees Abduction  - 3 x daily - 7 x weekly - 1 sets - 3 reps - 30 seconds  hold - Doorway Pec Stretch at 120 Degrees Abduction  - 3 x daily - 7 x weekly - 1 sets - 3 reps - 30 second hold  hold - Seated Shoulder External Rotation AAROM with Cane and Hand in Neutral  - 2 x daily - 7 x weekly - 1 sets - 5-10 reps - 5-10 sec  hold - Standing Bilateral Low Shoulder Row with Anchored Resistance  - 2 x daily - 7 x weekly - 1-3 sets - 10 reps - 2-3 sec  hold - Supine PNF D2 Flexion with Resistance  - 1 x daily - 7 x weekly - 3 sets - 10 reps - Supine Single Arm Shoulder PNF D1 Flexion  - 1 x daily - 7 x weekly - 3 sets - 10 reps  ASSESSMENT:  CLINICAL IMPRESSION: Pt continues to improve shoulder ROM. Good tolerance to progression of strengthening exercises. Added diagonals to HEP    GOALS: Goals reviewed with patient? Yes   LONG TERM GOALS: Target date: 01/05/2022  (Remove Blue Hyperlink)  Patient demonstrates full PROM Lt shoulder  Baseline:  Goal status: INITIAL  2.  Patient demonstrates AROM Lt shoulder to ~ equal to AROM Rt shoulder - with good scapular control  Baseline:  Goal status: INITIAL  3.  5/5 strength Rt shoulder  Baseline:  Goal status:  INITIAL  4.  Patient reports return normal functional activities with minimal to no Lt shoulder pain  Baseline:  Goal status: INITIAL  5.  Independent in HEP  Baseline:  Goal status: INITIAL  6.  Improve functional limitation score to 59 Baseline:  Goal status: INITIAL   PLAN: PT FREQUENCY: 3x/week  PT DURATION: 6 weeks  PLANNED  INTERVENTIONS: Therapeutic exercises, Therapeutic activity, Neuromuscular re-education, Patient/Family education, Self Care, Joint mobilization, Aquatic Therapy, Dry Needling, Electrical stimulation, Cryotherapy, Moist heat, Taping, Vasopneumatic device, Ultrasound, Manual therapy, and Re-evaluation  PLAN FOR NEXT SESSION: Review and progress HEP, progressing with patient doing AAROM to PROM progressing to active exercise and resistive exercises as tolerated. Focus on ROM post MUA Lt shoulder 10/31/21. Progressing with strengthening and stabilization as tolerated. Manual work and modalities as indicated.    Warrick Llera, PT, MPH  11/10/2021, 11:13 AM

## 2021-11-14 ENCOUNTER — Encounter: Payer: Self-pay | Admitting: Rehabilitative and Restorative Service Providers"

## 2021-11-14 ENCOUNTER — Other Ambulatory Visit: Payer: Self-pay

## 2021-11-14 ENCOUNTER — Ambulatory Visit: Payer: 59 | Admitting: Rehabilitative and Restorative Service Providers"

## 2021-11-14 DIAGNOSIS — M6281 Muscle weakness (generalized): Secondary | ICD-10-CM

## 2021-11-14 DIAGNOSIS — M25612 Stiffness of left shoulder, not elsewhere classified: Secondary | ICD-10-CM

## 2021-11-14 DIAGNOSIS — G8929 Other chronic pain: Secondary | ICD-10-CM

## 2021-11-14 DIAGNOSIS — M25512 Pain in left shoulder: Secondary | ICD-10-CM | POA: Diagnosis not present

## 2021-11-14 NOTE — Therapy (Signed)
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)    Diabetes (HCC) 09/09/2017   Dyslipidemia 07/22/2014   GERD (gastroesophageal reflux disease)    Past Surgical History:  Procedure Laterality Date   KIDNEY DONATION Right    Patient Active Problem List   Diagnosis Date Noted   Well adult exam 08/16/2021   Labral tear of shoulder 07/17/2021   Type 2 diabetes mellitus without complications (HCC) 09/09/2017   Achilles tendonitis, bilateral 09/13/2016   Absence of kidney 07/22/2014   Dyslipidemia 07/22/2014   Acid reflux 07/22/2014   Avitaminosis D 07/22/2014    PCP: Dr Jones Broom  REFERRING PROVIDER: Dr Everrett Coombe  REFERRING DIAG: Lt shoulder pain   THERAPY DIAG:  Chronic left shoulder pain  Stiffness of left shoulder, not elsewhere classified  Muscle weakness (generalized)  Rationale for Evaluation and Treatment Rehabilitation  ONSET DATE: MUA 10/31/21  SUBJECTIVE:                                                                                                                                                                                      SUBJECTIVE STATEMENT: Pt states he is using Lt UE more for functional activities. Did 4 hours of yard work this weekend. He is working on his exercises at home. No pain but has some soreness.   PERTINENT HISTORY: See above. Patient also reports that he is pre-diabetic  PAIN:  Are you having pain? Yes: NPRS scale: 0/10 Pain location: shoulder  Pain description: no pain some soreness  Aggravating factors: lifting; using Relieving factors: ice    PATIENT GOALS use Lt UE normally again   OBJECTIVE:     UPPER EXTREMITY ROM:   Passive ROM Right eval  Left eval Left  11/07/21  Shoulder flexion 180  171 175   Shoulder extension   60 70  Shoulder abduction   170 180  Shoulder adduction      Shoulder internal rotation   35 in scapular plane 67 in scapular plane   Shoulder external rotation   90 in scapular plane 95 in scapular plane   Elbow flexion   WNL's   Elbow extension   WNL's    Wrist flexion   WNL's   Wrist extension   WNL's   Wrist ulnar deviation   WNL's   Wrist radial deviation   WNL's   Wrist pronation   WNL's   Wrist supination   WNL's   (Blank rows = not  tested)     PALPATION:  WFL's    TODAY'S TREATMENT:  11/14/21 Therex: Pulley - flexion 10 sec x 10  Pulley scaption 10 x 10 Pulley horiz ab/add in mid range Pulley IR 10 sec hold x 8 reps  IR with strap ball ant Lt shd 20 sec x 5 reps Doorway stretch 3 positions x 30 each Wall push up 4" ball Lt hand x 10  Wall push up with clap x 10  Row blue TB x 10 Bow and arrow blue TB x 10 each side ER blue TB x 5 IR blue TB x 5 Serratus red TB x 10 High row with ER yellow x 10 reps   Manual: PROM shoulder and scapula all planes supine and sidelying  Modalities: Ice pack x 10 min   PATIENT EDUCATION: Education details: POC; HEP; ICE Person educated: Patient and Spouse Education method: Explanation, Demonstration, Tactile cues, and Verbal cues Education comprehension: verbalized understanding, returned demonstration, verbal cues required, tactile cues required, and needs further education   HOME EXERCISE PROGRAM: Access Code: [email or web address] Access Code: [email or web address] URL: [email or web address] Date: 11/14/2021 Prepared by: Corlis Leak  Exercises - Doorway Pec Stretch at 60 Degrees Abduction  - 3 x daily - 7 x weekly - 1 sets - 3 reps - Doorway Pec Stretch at 90 Degrees Abduction  - 3 x daily - 7 x weekly - 1 sets - 3 reps - 30 seconds  hold - Doorway Pec Stretch at 120 Degrees Abduction  - 3 x daily - 7 x weekly - 1 sets - 3 reps - 30 second hold  hold - Standing Shoulder Internal Rotation Stretch with Towel   - 2 x daily - 7 x weekly - 1 sets - 3-5 reps - 15-20 sec  hold - Seated Shoulder External Rotation AAROM with Cane and Hand in Neutral  - 2 x daily - 7 x weekly - 1 sets - 5-10 reps - 5-10 sec  hold - Standing Bilateral Low Shoulder Row with Anchored Resistance  - 2 x daily - 7 x weekly - 1-3 sets - 10 reps - 2-3 sec  hold - Drawing Bow  - 1 x daily - 7 x weekly - 1 sets - 10 reps - 3 sec  hold - Supine PNF D2 Flexion with Resistance  - 1 x daily - 7 x weekly - 3 sets - 10 reps - Shoulder External Rotation with Anchored Resistance  - 2 x daily - 7 x weekly - 1-2 sets - 10 reps - 3 sec  hold - Shoulder Internal Rotation with Resistance  - 2 x daily - 7 x weekly - 2 sets - 10 reps - 3 sec  hold - Shoulder Flexion Serratus Activation with Resistance  - 1 x daily - 7 x weekly - 1 sets - 10 reps - 3-5 sec  hold - Seated High Shoulder Row with Anchored Resistance  - 2 x daily - 7 x weekly - 1 sets - 10 reps - 3 sec  hold - Wall Push Up  - 1 x daily - 7 x weekly - 1-3 sets - 10 reps - 3 sec  hold  ASSESSMENT:  CLINICAL IMPRESSION: Pt continues to improve shoulder ROM and strength. Good tolerance to progression of strengthening exercises. Added resistive exercises to HEP    GOALS: Goals reviewed with patient? Yes   LONG TERM GOALS: Target date: 01/09/2022  (Remove Blue Hyperlink)  Patient demonstrates full PROM Lt shoulder  Baseline:  Goal status: INITIAL  2.  Patient demonstrates AROM Lt shoulder to ~ equal to AROM Rt shoulder - with good scapular control  Baseline:  Goal status: INITIAL  3.  5/5 strength Rt shoulder  Baseline:  Goal status: INITIAL  4.  Patient reports return normal functional activities with minimal to no Lt shoulder pain  Baseline:  Goal status: INITIAL  5.  Independent in HEP  Baseline:  Goal status: INITIAL  6.  Improve functional limitation score to 59 Baseline:  Goal status: INITIAL   PLAN: PT FREQUENCY: 3x/week  PT DURATION: 6 weeks  PLANNED  INTERVENTIONS: Therapeutic exercises, Therapeutic activity, Neuromuscular re-education, Patient/Family education, Self Care, Joint mobilization, Aquatic Therapy, Dry Needling, Electrical stimulation, Cryotherapy, Moist heat, Taping, Vasopneumatic device, Ultrasound, Manual therapy, and Re-evaluation  PLAN FOR NEXT SESSION: Review and progress HEP, progressing with patient doing AAROM to PROM progressing to active exercise and resistive exercises as tolerated. Focus on ROM  and strength post MUA Lt shoulder 10/31/21. Progressing with strengthening and stabilization. Manual work and modalities as indicated.    Val Riles, PT, MPH  11/14/2021, 9:31 AM

## 2021-11-16 ENCOUNTER — Ambulatory Visit: Payer: 59 | Admitting: Rehabilitative and Restorative Service Providers"

## 2021-11-16 ENCOUNTER — Encounter: Payer: Self-pay | Admitting: Rehabilitative and Restorative Service Providers"

## 2021-11-16 DIAGNOSIS — M25612 Stiffness of left shoulder, not elsewhere classified: Secondary | ICD-10-CM

## 2021-11-16 DIAGNOSIS — G8929 Other chronic pain: Secondary | ICD-10-CM

## 2021-11-16 DIAGNOSIS — M6281 Muscle weakness (generalized): Secondary | ICD-10-CM

## 2021-11-16 DIAGNOSIS — M25512 Pain in left shoulder: Secondary | ICD-10-CM | POA: Diagnosis not present

## 2021-11-16 NOTE — Therapy (Signed)
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)    Diabetes (HCC) 09/09/2017   Dyslipidemia 07/22/2014   GERD (gastroesophageal reflux disease)    Past Surgical History:  Procedure Laterality Date   KIDNEY DONATION Right    Patient Active Problem List   Diagnosis Date Noted   Well adult exam 08/16/2021   Labral tear of shoulder 07/17/2021   Type 2 diabetes mellitus without complications (HCC) 09/09/2017   Achilles tendonitis, bilateral 09/13/2016   Absence of kidney 07/22/2014   Dyslipidemia 07/22/2014   Acid reflux 07/22/2014   Avitaminosis D 07/22/2014    PCP: Dr Jones Broom  REFERRING PROVIDER: Dr Everrett Coombe  REFERRING DIAG: Lt shoulder pain   THERAPY DIAG:  Chronic left shoulder pain  Stiffness of left shoulder, not elsewhere classified  Muscle weakness (generalized)  Rationale for Evaluation and Treatment Rehabilitation  ONSET DATE: MUA 10/31/21  SUBJECTIVE:                                                                                                                                                                                      SUBJECTIVE STATEMENT: Pt reports that he is doing well. He went up on the theraband exercises and is sore in both arms. Still no pain.  PERTINENT HISTORY: See above. Patient also reports that he is pre-diabetic  PAIN:  Are you having pain? Yes: NPRS scale: 0/10 Pain location: shoulder  Pain description: no pain some soreness  Aggravating factors: lifting; using Relieving factors: ice    PATIENT GOALS use Lt UE normally again   OBJECTIVE:     UPPER EXTREMITY ROM:   Passive ROM Right eval  Left eval Left  11/07/21  Shoulder flexion 180  171 175  Shoulder extension   60 70  Shoulder abduction   170 180   Shoulder adduction      Shoulder internal rotation   35 in scapular plane 67 in scapular plane   Shoulder external rotation   90 in scapular plane 95 in scapular plane   Elbow flexion   WNL's   Elbow extension   WNL's    Wrist flexion   WNL's   Wrist extension   WNL's   Wrist ulnar deviation   WNL's   Wrist radial deviation   WNL's   Wrist pronation   WNL's   Wrist supination   WNL's   (Blank rows = not tested)     PALPATION:  WFL's  TODAY'S TREATMENT:  11/16/21 Therex: Pulley - flexion 10 sec x 10  Pulley scaption 10 x 10 Pulley horiz ab/add in mid range  UBE L4 x 4 min alt fwd/back ea minute IR with strap ball ant Lt shd 20 sec x 5 reps Doorway stretch 3 positions x 30 each Lat pull blue TB x 10 Row blue TB x 10 Bow and arrow blue TB x 10 each side ER blue TB x 10 IR blue TB x 10 Wall push up 4" ball Lt hand x 10  Wall push up with clap x 10  Serratus red TB x 10 High row with ER yellow x 10 reps  Counter plank 60 sec x 2 reps  Manual: PROM shoulder and scapula all planes supine and sidelying  Modalities: Ice pack x 10 min   PATIENT EDUCATION: Education details: POC; HEP; ICE Person educated: Patient and Spouse Education method: Explanation, Demonstration, Tactile cues, and Verbal cues Education comprehension: verbalized understanding, returned demonstration, verbal cues required, tactile cues required, and needs further education   HOME EXERCISE PROGRAM: Access Code: [email or web address] URL: [email or web address] Date: 11/16/2021 Prepared by: Corlis Leak  Exercises - Doorway Pec Stretch at 60 Degrees Abduction  - 3 x daily - 7 x weekly - 1 sets - 3 reps - Doorway Pec Stretch at 90 Degrees Abduction  - 3 x daily - 7 x weekly - 1 sets - 3 reps - 30 seconds  hold - Doorway Pec Stretch at 120 Degrees Abduction  - 3 x daily - 7 x weekly - 1 sets - 3 reps - 30 second hold  hold - Standing Shoulder Internal Rotation Stretch with Towel  - 2 x daily - 7 x  weekly - 1 sets - 3-5 reps - 15-20 sec  hold - Seated Shoulder External Rotation AAROM with Cane and Hand in Neutral  - 2 x daily - 7 x weekly - 1 sets - 5-10 reps - 5-10 sec  hold - Standing Bilateral Low Shoulder Row with Anchored Resistance  - 2 x daily - 7 x weekly - 1-3 sets - 10 reps - 2-3 sec  hold - Drawing Bow  - 1 x daily - 7 x weekly - 1 sets - 10 reps - 3 sec  hold - Supine PNF D2 Flexion with Resistance  - 1 x daily - 7 x weekly - 3 sets - 10 reps - Shoulder External Rotation with Anchored Resistance  - 2 x daily - 7 x weekly - 1-2 sets - 10 reps - 3 sec  hold - Shoulder Internal Rotation with Resistance  - 2 x daily - 7 x weekly - 2 sets - 10 reps - 3 sec  hold - Shoulder Flexion Serratus Activation with Resistance  - 1 x daily - 7 x weekly - 1 sets - 10 reps - 3-5 sec  hold - Seated High Shoulder Row with Anchored Resistance  - 2 x daily - 7 x weekly - 1 sets - 10 reps - 3 sec  hold - Wall Push Up  - 1 x daily - 7 x weekly - 1-3 sets - 10 reps - 3 sec  hold - Plank on Counter  - 2 x daily - 7 x weekly - 1 sets - 3 reps - 30 sec  hold - Standing Lat Pull Down with Resistance - Elbows Bent  - 2 x daily - 7 x weekly - 1 sets - 10 reps - 3 sec  hold  ASSESSMENT:  CLINICAL IMPRESSION: Pt is working on exercises at home. He has not pain. He continues to improve shoulder ROM and strength. Progressing with strengthening exercises.     GOALS: Goals reviewed with patient? Yes   LONG TERM GOALS: Target date: 01/11/2022  (Remove Blue Hyperlink)  Patient demonstrates full PROM Lt shoulder  Baseline:  Goal status: INITIAL  2.  Patient demonstrates AROM Lt shoulder to ~ equal to AROM Rt shoulder - with good scapular control  Baseline:  Goal status: INITIAL  3.  5/5 strength Rt shoulder  Baseline:  Goal status: INITIAL  4.  Patient reports return normal functional activities with minimal to no Lt shoulder pain  Baseline:  Goal status: INITIAL  5.  Independent in HEP   Baseline:  Goal status: INITIAL  6.  Improve functional limitation score to 59 Baseline:  Goal status: INITIAL   PLAN: PT FREQUENCY: 3x/week  PT DURATION: 6 weeks  PLANNED INTERVENTIONS: Therapeutic exercises, Therapeutic activity, Neuromuscular re-education, Patient/Family education, Self Care, Joint mobilization, Aquatic Therapy, Dry Needling, Electrical stimulation, Cryotherapy, Moist heat, Taping, Vasopneumatic device, Ultrasound, Manual therapy, and Re-evaluation  PLAN FOR NEXT SESSION: Review and progress HEP, progressing with patient doing AAROM to PROM progressing to active exercise and resistive exercises as tolerated. Focus on ROM  and strength post MUA Lt shoulder 10/31/21. Progressing with strengthening and stabilization. Manual work and modalities as indicated.    Val Riles, PT, MPH  11/16/2021, 8:52 AM

## 2021-11-21 ENCOUNTER — Ambulatory Visit: Payer: 59 | Admitting: Rehabilitative and Restorative Service Providers"

## 2021-11-21 ENCOUNTER — Encounter: Payer: Self-pay | Admitting: Rehabilitative and Restorative Service Providers"

## 2021-11-21 DIAGNOSIS — M25612 Stiffness of left shoulder, not elsewhere classified: Secondary | ICD-10-CM

## 2021-11-21 DIAGNOSIS — M6281 Muscle weakness (generalized): Secondary | ICD-10-CM

## 2021-11-21 DIAGNOSIS — M25512 Pain in left shoulder: Secondary | ICD-10-CM | POA: Diagnosis not present

## 2021-11-21 DIAGNOSIS — G8929 Other chronic pain: Secondary | ICD-10-CM

## 2021-11-21 NOTE — Therapy (Signed)
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)    Diabetes ([place]) 09/09/2017   Dyslipidemia 07/22/2014   GERD (gastroesophageal reflux disease)    Past Surgical History:  Procedure Laterality Date   KIDNEY DONATION Right    Patient Active Problem List   Diagnosis Date Noted   Well adult exam 08/16/2021   Labral tear of shoulder 07/17/2021   Type 2 diabetes mellitus without complications ([place]) [id number]   Achilles tendonitis, bilateral 09/13/2016   Absence of kidney 07/22/2014   Dyslipidemia 07/22/2014   Acid reflux 07/22/2014   Avitaminosis D 07/22/2014    PCP: Dr Tania Ade  REFERRING PROVIDER: Dr Luetta Nutting  REFERRING DIAG: Lt shoulder pain   THERAPY DIAG:  Chronic left shoulder pain  Stiffness of left shoulder, not elsewhere classified  Muscle weakness (generalized)  Rationale for Evaluation and Treatment Rehabilitation  ONSET DATE: MUA 10/31/21  SUBJECTIVE:                                                                                                                                                                                      SUBJECTIVE STATEMENT: 11/21/21:  His shoulder is doing well. He has grouped the HEP handouts and working on his exercises at home.     PERTINENT HISTORY: See above. Patient also reports that he is pre-diabetic  PAIN:  Are you having pain? Yes: NPRS scale: 0/10 Pain location: shoulder  Pain description: no pain some soreness  Aggravating factors: lifting; using Relieving factors: ice    PATIENT GOALS use Lt UE normally again   OBJECTIVE:     UPPER EXTREMITY ROM:   Passive ROM Right eval  Left eval Left  11/07/21  Shoulder flexion 180  171 175  Shoulder extension   60 70  Shoulder abduction   170 180   Shoulder adduction      Shoulder internal rotation   35 in scapular plane 67 in scapular plane   Shoulder external rotation   90 in scapular plane 95 in scapular plane   Elbow flexion   WNL's   Elbow extension   WNL's    Wrist flexion   WNL's   Wrist extension   WNL's   Wrist ulnar deviation   WNL's   Wrist radial deviation   WNL's   Wrist pronation   WNL's   Wrist supination   WNL's   (Blank rows = not tested)     PALPATION:  WFL's  TODAY'S TREATMENT:  11/21/21 Therex: Pulley - flexion 10 sec x 10  Pulley scaption 10 x 10 Pulley horiz ab/add in mid range  UBE L5 x 4 min alt fwd/back ea minute IR with strap ball ant Lt shd 20 sec x 5 reps Doorway stretch 3 positions x 30 each Lat pull blue TB x 10 Row blue TB x 10 Bow and arrow blue TB x 10 each side ER blue TB x 10 IR blue TB x 10 Wall push up 4" ball Lt hand x 10  Wall push up with clap x 10  Serratus red TB x 10 High row with ER yellow x 10 reps  Counter plank 60 sec x 2 reps  Manual: PROM shoulder and scapula all planes supine and sidelying  Modalities: Ice pack x 10 min   PATIENT EDUCATION: Education details: POC; HEP; ICE Person educated: Patient and Spouse Education method: Explanation, Demonstration, Tactile cues, and Verbal cues Education comprehension: verbalized understanding, returned demonstration, verbal cues required, tactile cues required, and needs further education   HOME EXERCISE PROGRAM: Access Code: [email or web address] URL: [email or web address] Date: 11/21/2021 Prepared by: Corlis Leak  Exercises - Doorway Pec Stretch at 60 Degrees Abduction  - 3 x daily - 7 x weekly - 1 sets - 3 reps - Doorway Pec Stretch at 90 Degrees Abduction  - 3 x daily - 7 x weekly - 1 sets - 3 reps - 30 seconds  hold - Doorway Pec Stretch at 120 Degrees Abduction  - 3 x daily - 7 x weekly - 1 sets - 3 reps - 30 second hold  hold - Standing Shoulder Internal Rotation Stretch with Towel  - 2 x daily - 7 x  weekly - 1 sets - 3-5 reps - 15-20 sec  hold - Seated Shoulder External Rotation AAROM with Cane and Hand in Neutral  - 2 x daily - 7 x weekly - 1 sets - 5-10 reps - 5-10 sec  hold - Standing Bilateral Low Shoulder Row with Anchored Resistance  - 2 x daily - 7 x weekly - 1-3 sets - 10 reps - 2-3 sec  hold - Drawing Bow  - 1 x daily - 7 x weekly - 1 sets - 10 reps - 3 sec  hold - Supine PNF D2 Flexion with Resistance  - 1 x daily - 7 x weekly - 3 sets - 10 reps - Shoulder External Rotation with Anchored Resistance  - 2 x daily - 7 x weekly - 1-2 sets - 10 reps - 3 sec  hold - Shoulder Internal Rotation with Resistance  - 2 x daily - 7 x weekly - 2 sets - 10 reps - 3 sec  hold - Shoulder Flexion Serratus Activation with Resistance  - 1 x daily - 7 x weekly - 1 sets - 10 reps - 3-5 sec  hold - Seated High Shoulder Row with Anchored Resistance  - 2 x daily - 7 x weekly - 1 sets - 10 reps - 3 sec  hold - Wall Push Up  - 1 x daily - 7 x weekly - 1-3 sets - 10 reps - 3 sec  hold - Plank on Counter  - 2 x daily - 7 x weekly - 1 sets - 3 reps - 30 sec  hold - Standing Lat Pull Down with Resistance - Elbows Bent  - 2 x daily - 7 x weekly - 1 sets - 10 reps - 3 sec  hold - Child's  Pose Stretch  - 2 x daily - 7 x weekly - 1 sets - 3 reps - 30 sec  hold  ASSESSMENT:  CLINICAL IMPRESSION: 11/21/21:  Patient continues to progress well with shoulder rehab post MUA. Pt is working on exercises at home. He continues with improving shoulder ROM and strength. Continues to have tightness in shoulder IR and abduction. Progressing with strengthening exercises. Note some continued tightness end ranges IR/abduction.    GOALS: Goals reviewed with patient? Yes   LONG TERM GOALS: Target date: 01/16/2022  (Remove Blue Hyperlink)  Patient demonstrates full PROM Lt shoulder  Baseline:  Goal status: INITIAL  2.  Patient demonstrates AROM Lt shoulder to ~ equal to AROM Rt shoulder - with good scapular control   Baseline:  Goal status: INITIAL  3.  5/5 strength Rt shoulder  Baseline:  Goal status: INITIAL  4.  Patient reports return normal functional activities with minimal to no Lt shoulder pain  Baseline:  Goal status: INITIAL  5.  Independent in HEP  Baseline:  Goal status: INITIAL  6.  Improve functional limitation score to 59 Baseline:  Goal status: INITIAL   PLAN: PT FREQUENCY: 3x/week  PT DURATION: 6 weeks  PLANNED INTERVENTIONS: Therapeutic exercises, Therapeutic activity, Neuromuscular re-education, Patient/Family education, Self Care, Joint mobilization, Aquatic Therapy, Dry Needling, Electrical stimulation, Cryotherapy, Moist heat, Taping, Vasopneumatic device, Ultrasound, Manual therapy, and Re-evaluation  PLAN FOR NEXT SESSION: Review and progress HEP, progressing with patient doing AAROM to PROM progressing to active exercise and resistive exercises as tolerated. Focus on ROM  and strength post MUA Lt shoulder 10/31/21. Progressing with strengthening and stabilization. Manual work and modalities as indicated.    Val Riles, PT, MPH  11/21/2021, 9:32 AM

## 2021-11-23 ENCOUNTER — Ambulatory Visit: Payer: 59 | Admitting: Rehabilitative and Restorative Service Providers"

## 2021-11-23 ENCOUNTER — Encounter: Payer: Self-pay | Admitting: Rehabilitative and Restorative Service Providers"

## 2021-11-23 DIAGNOSIS — G8929 Other chronic pain: Secondary | ICD-10-CM

## 2021-11-23 DIAGNOSIS — M25512 Pain in left shoulder: Secondary | ICD-10-CM | POA: Diagnosis not present

## 2021-11-23 DIAGNOSIS — M25612 Stiffness of left shoulder, not elsewhere classified: Secondary | ICD-10-CM

## 2021-11-23 DIAGNOSIS — M6281 Muscle weakness (generalized): Secondary | ICD-10-CM

## 2021-11-23 NOTE — Therapy (Signed)
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)    Diabetes (HCC) 09/09/2017   Dyslipidemia 07/22/2014   GERD (gastroesophageal reflux disease)    Past Surgical History:  Procedure Laterality Date   KIDNEY DONATION Right    Patient Active Problem List   Diagnosis Date Noted   Well adult exam 08/16/2021   Labral tear of shoulder 07/17/2021   Type 2 diabetes mellitus without complications (HCC) 09/09/2017   Achilles tendonitis, bilateral 09/13/2016   Absence of kidney 07/22/2014   Dyslipidemia 07/22/2014   Acid reflux 07/22/2014   Avitaminosis D 07/22/2014    PCP: Dr Jones Broom  REFERRING PROVIDER: Dr Everrett Coombe  REFERRING DIAG: Lt shoulder pain   THERAPY DIAG:  Chronic left shoulder pain  Stiffness of left shoulder, not elsewhere classified  Muscle weakness (generalized)  Rationale for Evaluation and Treatment Rehabilitation  ONSET DATE: MUA 10/31/21  SUBJECTIVE:                                                                                                                                                                                      SUBJECTIVE STATEMENT: 11/23/21:  His shoulder is doing well. Still tight at end ranges.      PERTINENT HISTORY: See above. Patient also reports that he is pre-diabetic  PAIN:  11/23/21: Are you having pain? Yes: NPRS scale: 0/10 Pain location: shoulder  Pain description: no pain some soreness  Aggravating factors: lifting; using Relieving factors: ice    PATIENT GOALS use Lt UE normally again   OBJECTIVE:     UPPER EXTREMITY ROM:   Passive ROM Right eval Right  11/23/21 Left eval Left  11/07/21 Left  11/23/21  Shoulder flexion 180  171 175 180  Shoulder extension  82 60 70 72  Shoulder abduction    170 180 180  Shoulder adduction       Shoulder internal rotation   35 in scapular plane 67 in scapular plane    Shoulder external rotation   90 in scapular plane 95 in scapular plane    Elbow flexion   WNL's    Elbow extension   WNL's     Wrist flexion   WNL's    Wrist extension   WNL's    Wrist ulnar deviation   WNL's    Wrist radial deviation   WNL's    Wrist pronation   WNL's    Wrist supination   WNL's    (Blank rows =  not tested)     PALPATION:  WFL's    TODAY'S TREATMENT:  11/23/21 Therex: Pulley - flexion 10 sec x 10  Pulley scaption 10 x 10 Pulley horiz ab/add in mid range  UBE L6 x 4 min alt fwd/back ea minute IR with strap w/ball ant Lt shd 20 sec x 5 reps Doorway stretch 3 positions x 30 each Lat pull blue TB x 10 Row blue TB x 10 Bow and arrow blue TB x 10 each side ER blue TB x 10 IR blue TB x 10 Wall push up 4" ball Lt hand x 10  Wall push up with clap x 10  Serratus red TB x 10 High row with ER yellow x 10 reps  Counter plank 60 sec x 2 reps Bent row 10# KB x 20  Overhead press bamboo pole w/ 2.5# each side  ER w/back to wall, ball on dorsum of hand ~ 1-2 min Wall angel x 10  Manual: PROM shoulder and scapula all planes supine and sidelying  Modalities: None today  PATIENT EDUCATION: Education details: POC; HEP; ICE Person educated: Patient and Spouse Education method: Explanation, Demonstration, Tactile cues, and Verbal cues Education comprehension: verbalized understanding, returned demonstration, verbal cues required, tactile cues required, and needs further education   HOME EXERCISE PROGRAM: Access Code: [email or web address] URL: [email or web address] Date: 11/23/2021 Prepared by: Corlis Leak  Exercises - Doorway Pec Stretch at 60 Degrees Abduction  - 3 x daily - 7 x weekly - 1 sets - 3 reps - Doorway Pec Stretch at 90 Degrees Abduction  - 3 x daily - 7 x weekly - 1 sets - 3 reps - 30 seconds  hold - Doorway Pec Stretch at 120  Degrees Abduction  - 3 x daily - 7 x weekly - 1 sets - 3 reps - 30 second hold  hold - Standing Shoulder Internal Rotation Stretch with Towel  - 2 x daily - 7 x weekly - 1 sets - 3-5 reps - 15-20 sec  hold - Seated Shoulder External Rotation AAROM with Cane and Hand in Neutral  - 2 x daily - 7 x weekly - 1 sets - 5-10 reps - 5-10 sec  hold - Standing Bilateral Low Shoulder Row with Anchored Resistance  - 2 x daily - 7 x weekly - 1-3 sets - 10 reps - 2-3 sec  hold - Drawing Bow  - 1 x daily - 7 x weekly - 1 sets - 10 reps - 3 sec  hold - Supine PNF D2 Flexion with Resistance  - 1 x daily - 7 x weekly - 3 sets - 10 reps - Shoulder External Rotation with Anchored Resistance  - 2 x daily - 7 x weekly - 1-2 sets - 10 reps - 3 sec  hold - Shoulder Internal Rotation with Resistance  - 2 x daily - 7 x weekly - 2 sets - 10 reps - 3 sec  hold - Shoulder Flexion Serratus Activation with Resistance  - 1 x daily - 7 x weekly - 1 sets - 10 reps - 3-5 sec  hold - Seated High Shoulder Row with Anchored Resistance  - 2 x daily - 7 x weekly - 1 sets - 10 reps - 3 sec  hold - Wall Push Up  - 1 x daily - 7 x weekly - 1-3 sets - 10 reps - 3 sec  hold - Plank on Counter  - 2 x daily - 7 x weekly - 1 sets -  3 reps - 30 sec  hold - Standing Lat Pull Down with Resistance - Elbows Bent  - 1 x daily - 7 x weekly - 1 sets - 10 reps - 3 sec  hold - Child's Pose Stretch  - 1 x daily - 7 x weekly - 1 sets - 3 reps - 30 sec  hold - Shoulder Overhead Press in Flexion with Dumbbells  - 1 x daily - 7 x weekly - 1 sets - 10 reps - 3 sec  hold - Standing Bent Over Single Arm Shoulder Row  - 1 x daily - 7 x weekly - 2-3 sets - 10 reps - 3 sec  hold - Isometric Shoulder External Rotation in Abduction with Ball at Wall  - 1 x daily - 7 x weekly - 1-2 min hold - Quadruped Marathon Oil with Resistance  - 1 x daily - 7 x weekly - 1 sets - 10 reps - 3 sec  hold - Wall Angels  - 2 x daily - 7 x weekly - 1 sets - 10 reps - 2-3 sec   hold  ASSESSMENT:  CLINICAL IMPRESSION: 11/23/21:  Patient continues to progress with shoulder rehab post MUA. Pt is working on exercises at home. He continues with improving shoulder ROM and strength. Continues to have tightness in shoulder extension, IR and abduction. Progressing with strengthening exercises.     GOALS: Goals reviewed with patient? Yes   LONG TERM GOALS: Target date: 01/18/2022  (Remove Blue Hyperlink)  Patient demonstrates full PROM Lt shoulder  Baseline:  Goal status: INITIAL  2.  Patient demonstrates AROM Lt shoulder to ~ equal to AROM Rt shoulder - with good scapular control  Baseline:  Goal status: INITIAL  3.  5/5 strength Rt shoulder  Baseline:  Goal status: INITIAL  4.  Patient reports return normal functional activities with minimal to no Lt shoulder pain  Baseline:  Goal status: INITIAL  5.  Independent in HEP  Baseline:  Goal status: INITIAL  6.  Improve functional limitation score to 59 Baseline:  Goal status: INITIAL   PLAN: PT FREQUENCY: 3x/week  PT DURATION: 6 weeks  PLANNED INTERVENTIONS: Therapeutic exercises, Therapeutic activity, Neuromuscular re-education, Patient/Family education, Self Care, Joint mobilization, Aquatic Therapy, Dry Needling, Electrical stimulation, Cryotherapy, Moist heat, Taping, Vasopneumatic device, Ultrasound, Manual therapy, and Re-evaluation  PLAN FOR NEXT SESSION: Review and progress HEP, progressing with patient doing AAROM to PROM progressing to active exercise and resistive exercises as tolerated. Focus on ROM  and strength post MUA with I & D Lt shoulder 10/31/21. Progressing with strengthening and stabilization. Manual work and modalities as indicated.    Val Riles, PT, MPH  11/23/2021, 9:04 AM

## 2021-11-28 ENCOUNTER — Encounter: Payer: 59 | Admitting: Rehabilitative and Restorative Service Providers"

## 2021-11-30 ENCOUNTER — Ambulatory Visit: Payer: 59 | Attending: Orthopedic Surgery | Admitting: Rehabilitative and Restorative Service Providers"

## 2021-11-30 ENCOUNTER — Encounter: Payer: Self-pay | Admitting: Rehabilitative and Restorative Service Providers"

## 2021-11-30 DIAGNOSIS — G8929 Other chronic pain: Secondary | ICD-10-CM | POA: Insufficient documentation

## 2021-11-30 DIAGNOSIS — M6281 Muscle weakness (generalized): Secondary | ICD-10-CM | POA: Diagnosis present

## 2021-11-30 DIAGNOSIS — M25512 Pain in left shoulder: Secondary | ICD-10-CM | POA: Insufficient documentation

## 2021-11-30 DIAGNOSIS — M25612 Stiffness of left shoulder, not elsewhere classified: Secondary | ICD-10-CM | POA: Diagnosis present

## 2021-11-30 NOTE — Therapy (Signed)
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)    Diabetes (HCC) 09/09/2017   Dyslipidemia 07/22/2014   GERD (gastroesophageal reflux disease)    Past Surgical History:  Procedure Laterality Date   KIDNEY DONATION Right    Patient Active Problem List   Diagnosis Date Noted   Well adult exam 08/16/2021   Labral tear of shoulder 07/17/2021   Type 2 diabetes mellitus without complications (HCC) 09/09/2017   Achilles tendonitis, bilateral 09/13/2016   Absence of kidney 07/22/2014   Dyslipidemia 07/22/2014   Acid reflux 07/22/2014   Avitaminosis D 07/22/2014    PCP: Dr Jones Broom  REFERRING PROVIDER: Dr Everrett Coombe  REFERRING DIAG: Lt shoulder pain   THERAPY DIAG:  Chronic left shoulder pain  Stiffness of left shoulder, not elsewhere classified  Muscle weakness (generalized)  Rationale for Evaluation and Treatment Rehabilitation  ONSET DATE: MUA 10/31/21  SUBJECTIVE:                                                                                                                                                                                      SUBJECTIVE STATEMENT: 11/30/21:  RTD - 12/13/21 Some soreness in the shoulder. Did 3+ hours of yard work this weekend. His shoulder is doing well overall. Still tight at end ranges.      PERTINENT HISTORY: See above. Patient also reports that he is pre-diabetic  PAIN:  11/30/21: Are you having pain? Yes: NPRS scale: 0/10 Pain location: shoulder  Pain description: no pain some soreness  Aggravating factors: lifting; using Relieving factors: ice    PATIENT GOALS use Lt UE normally again   OBJECTIVE:     UPPER EXTREMITY ROM:   Passive ROM Right eval Right  11/23/21 Left eval Left  11/07/21 Left  11/23/21   Shoulder flexion 180  171 175 180  Shoulder extension  82 60 70 72  Shoulder abduction   170 180 180  Shoulder adduction       Shoulder internal rotation   35 in scapular plane 67 in scapular plane    Shoulder external rotation   90 in scapular plane 95 in scapular plane    Elbow flexion   WNL's    Elbow extension   WNL's     Wrist flexion   WNL's    Wrist extension   WNL's    Wrist ulnar deviation   WNL's    Wrist radial deviation   WNL's    Wrist  pronation   WNL's    Wrist supination   WNL's    (Blank rows = not tested)     PALPATION:  WFL's    TODAY'S TREATMENT:  11/30/21 Therex: Pulley - flexion 10 sec x 10  Pulley scaption 10 x 10 Pulley horiz ab/add in mid range  UBE L6 x 4 min alt fwd/back ea minute IR with strap w/ball ant Lt shd 20 sec x 5 reps Doorway stretch 3 positions x 30 each Lat pull blue TB x 10 Row blue TB x 10 Bow and arrow blue TB x 10 each side ER blue TB x 10 IR blue TB x 10 Wall push up 4" ball Lt hand x 10  Wall push up with clap x 10  Serratus red TB x 10 High row with ER yellow x 10 reps  Counter plank 60 sec x 2 reps Bent row 10# KB x 20  Overhead press bamboo pole w/ 2.5# each side  ER w/back to wall, ball on dorsum of hand ~ 1-2 min Wall angel x 10 Therex ball on wall stretch into flexion Therex ball on wall bouncing ball on wall Therex ball on wall active shoulder flexion end range Childs pose hands on bolster 30-60 sec hold x 2   Manual: PROM shoulder and scapula all planes supine   Modalities: None today  PATIENT EDUCATION: Education details: POC; HEP; ICE Person educated: Patient and Spouse Education method: Programmer, multimedia, Facilities manager, Actor cues, and Verbal cues Education comprehension: verbalized understanding, returned demonstration, verbal cues required, tactile cues required, and needs further education   HOME EXERCISE PROGRAM: Access Code: [email or web address] URL: [email or web address] Date:  11/23/2021 Prepared by: Corlis Leak  Exercises - Doorway Pec Stretch at 60 Degrees Abduction  - 3 x daily - 7 x weekly - 1 sets - 3 reps - Doorway Pec Stretch at 90 Degrees Abduction  - 3 x daily - 7 x weekly - 1 sets - 3 reps - 30 seconds  hold - Doorway Pec Stretch at 120 Degrees Abduction  - 3 x daily - 7 x weekly - 1 sets - 3 reps - 30 second hold  hold - Standing Shoulder Internal Rotation Stretch with Towel  - 2 x daily - 7 x weekly - 1 sets - 3-5 reps - 15-20 sec  hold - Seated Shoulder External Rotation AAROM with Cane and Hand in Neutral  - 2 x daily - 7 x weekly - 1 sets - 5-10 reps - 5-10 sec  hold - Standing Bilateral Low Shoulder Row with Anchored Resistance  - 2 x daily - 7 x weekly - 1-3 sets - 10 reps - 2-3 sec  hold - Drawing Bow  - 1 x daily - 7 x weekly - 1 sets - 10 reps - 3 sec  hold - Supine PNF D2 Flexion with Resistance  - 1 x daily - 7 x weekly - 3 sets - 10 reps - Shoulder External Rotation with Anchored Resistance  - 2 x daily - 7 x weekly - 1-2 sets - 10 reps - 3 sec  hold - Shoulder Internal Rotation with Resistance  - 2 x daily - 7 x weekly - 2 sets - 10 reps - 3 sec  hold - Shoulder Flexion Serratus Activation with Resistance  - 1 x daily - 7 x weekly - 1 sets - 10 reps - 3-5 sec  hold - Seated High Shoulder Row with Anchored Resistance  - 2 x daily - 7 x  weekly - 1 sets - 10 reps - 3 sec  hold - Wall Push Up  - 1 x daily - 7 x weekly - 1-3 sets - 10 reps - 3 sec  hold - Plank on Counter  - 2 x daily - 7 x weekly - 1 sets - 3 reps - 30 sec  hold - Standing Lat Pull Down with Resistance - Elbows Bent  - 1 x daily - 7 x weekly - 1 sets - 10 reps - 3 sec  hold - Child's Pose Stretch  - 1 x daily - 7 x weekly - 1 sets - 3 reps - 30 sec  hold - Shoulder Overhead Press in Flexion with Dumbbells  - 1 x daily - 7 x weekly - 1 sets - 10 reps - 3 sec  hold - Standing Bent Over Single Arm Shoulder Row  - 1 x daily - 7 x weekly - 2-3 sets - 10 reps - 3 sec  hold - Isometric  Shoulder External Rotation in Abduction with Ball at Wall  - 1 x daily - 7 x weekly - 1-2 min hold - Quadruped Marathon Oil with Resistance  - 1 x daily - 7 x weekly - 1 sets - 10 reps - 3 sec  hold - Wall Angels  - 2 x daily - 7 x weekly - 1 sets - 10 reps - 2-3 sec  hold  ASSESSMENT:  CLINICAL IMPRESSION: 11/30/21:  Some soreness in the Lt shoulder following end range stretching and some work/exercises. Patient continues to progress with shoulder rehab post MUA. He continues with improving shoulder ROM and strength. Continues to have tightness in shoulder extension, IR and abduction at end ranges. Progressing with strengthening exercises.     GOALS: Goals reviewed with patient? Yes   LONG TERM GOALS: Target date: 01/25/2022  (Remove Blue Hyperlink)  Patient demonstrates full PROM Lt shoulder  Baseline:  Goal status: INITIAL  2.  Patient demonstrates AROM Lt shoulder to ~ equal to AROM Rt shoulder - with good scapular control  Baseline:  Goal status: INITIAL  3.  5/5 strength Rt shoulder  Baseline:  Goal status: INITIAL  4.  Patient reports return normal functional activities with minimal to no Lt shoulder pain  Baseline:  Goal status: INITIAL  5.  Independent in HEP  Baseline:  Goal status: INITIAL  6.  Improve functional limitation score to 59 Baseline:  Goal status: INITIAL   PLAN: PT FREQUENCY: 3x/week  PT DURATION: 6 weeks  PLANNED INTERVENTIONS: Therapeutic exercises, Therapeutic activity, Neuromuscular re-education, Patient/Family education, Self Care, Joint mobilization, Aquatic Therapy, Dry Needling, Electrical stimulation, Cryotherapy, Moist heat, Taping, Vasopneumatic device, Ultrasound, Manual therapy, and Re-evaluation  PLAN FOR NEXT SESSION: Review and progress HEP, progressing with patient doing AAROM to PROM progressing to active exercise and resistive exercises as tolerated. Focus on ROM  and strength post MUA with I & D Lt shoulder 10/31/21. Progressing  with strengthening and stabilization. Manual work and modalities as indicated.    Val Riles, PT, MPH  11/30/2021, 8:44 AM

## 2021-12-05 ENCOUNTER — Encounter: Payer: Self-pay | Admitting: Rehabilitative and Restorative Service Providers"

## 2021-12-05 ENCOUNTER — Ambulatory Visit: Payer: 59 | Admitting: Rehabilitative and Restorative Service Providers"

## 2021-12-05 DIAGNOSIS — M25512 Pain in left shoulder: Secondary | ICD-10-CM | POA: Diagnosis not present

## 2021-12-05 DIAGNOSIS — M6281 Muscle weakness (generalized): Secondary | ICD-10-CM

## 2021-12-05 DIAGNOSIS — G8929 Other chronic pain: Secondary | ICD-10-CM

## 2021-12-05 DIAGNOSIS — M25612 Stiffness of left shoulder, not elsewhere classified: Secondary | ICD-10-CM

## 2021-12-05 NOTE — Therapy (Signed)
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)    Diabetes (HCC) 09/09/2017   Dyslipidemia 07/22/2014   GERD (gastroesophageal reflux disease)    Past Surgical History:  Procedure Laterality Date   KIDNEY DONATION Right    Patient Active Problem List   Diagnosis Date Noted   Well adult exam 08/16/2021   Labral tear of shoulder 07/17/2021   Type 2 diabetes mellitus without complications (HCC) 09/09/2017   Achilles tendonitis, bilateral 09/13/2016   Absence of kidney 07/22/2014   Dyslipidemia 07/22/2014   Acid reflux 07/22/2014   Avitaminosis D 07/22/2014    PCP: Dr Jones Broom  REFERRING PROVIDER: Dr Everrett Coombe  REFERRING DIAG: Lt shoulder pain   THERAPY DIAG:  Chronic left shoulder pain  Stiffness of left shoulder, not elsewhere classified  Muscle weakness (generalized)  Rationale for Evaluation and Treatment Rehabilitation  ONSET DATE: MUA 10/31/21  SUBJECTIVE:                                                                                                                                                                                      SUBJECTIVE STATEMENT: 12/05/21:  RTD - 12/13/21 Some soreness in the shoulder and notices a tremor at times. His shoulder is doing well overall. Still tight at end ranges and will have some pain with certain positions.    PERTINENT HISTORY: See above. Patient also reports that he is pre-diabetic  PAIN:  12/05/21: Are you having pain? Yes: NPRS scale: 0/10 Pain location: shoulder  Pain description: no pain some soreness  Aggravating factors: lifting; using Relieving factors: ice    PATIENT GOALS use Lt UE normally again   OBJECTIVE:     UPPER EXTREMITY ROM:   Passive ROM Right eval Right  11/23/21  Left eval Left  11/07/21 Left  11/23/21  Shoulder flexion 180  171 175 180  Shoulder extension  82 60 70 72  Shoulder abduction   170 180 180  Shoulder adduction       Shoulder internal rotation   35 in scapular plane 67 in scapular plane    Shoulder external rotation   90 in scapular plane 95 in scapular plane    Elbow flexion   WNL's    Elbow extension   WNL's     Wrist flexion   WNL's    Wrist extension   WNL's    Wrist ulnar deviation   WNL's    Wrist radial deviation   WNL's  Wrist pronation   WNL's    Wrist supination   WNL's    (Blank rows = not tested)     PALPATION:  WFL's    TODAY'S TREATMENT:  12/05/21 Therex: Pulley - flexion 10 sec x 10  Pulley scaption 10 x 10 Pulley horiz ab/add in mid range  UBE L6 x 4 min alt fwd/back ea minute Prone shoulder flexion end range Lt only and bilat  Standing shoulder flexion end range bilat x 20-25 reps   IR with strap w/ball ant Lt shd 20 sec x 5 reps Doorway stretch 3 positions x 30 each Ball release in standing lateral scap border UE in ER Sidelying sleeper stretch with noodle 30 sec x 3  Push up blocks press ups sitting x 10 reps   11/30/21 Lat pull blue TB x 10 Row blue TB x 10 Bow and arrow blue TB x 10 each side ER blue TB x 10 IR blue TB x 10 Wall push up 4" ball Lt hand x 10  Wall push up with clap x 10  Serratus red TB x 10 High row with ER yellow x 10 reps  Counter plank 60 sec x 2 reps Bent row 10# KB x 20  Overhead press bamboo pole w/ 2.5# each side  ER w/back to wall, ball on dorsum of hand ~ 1-2 min Wall angel x 10 Therex ball on wall stretch into flexion Therex ball on wall bouncing ball on wall Therex ball on wall active shoulder flexion end range Childs pose hands on bolster 30-60 sec hold x 2   Manual: PROM shoulder and scapula mobs all planes supine   Modalities: None today  PATIENT EDUCATION: Education details: POC; HEP; ICE Person educated: Patient and Spouse Education method:  Programmer, multimedia, Facilities manager, Actor cues, and Verbal cues Education comprehension: verbalized understanding, returned demonstration, verbal cues required, tactile cues required, and needs further education   HOME EXERCISE PROGRAM: Access Code: [email or web address] URL: [email or web address] Date: 11/23/2021 Prepared by: Corlis Leak  Exercises - Doorway Pec Stretch at 60 Degrees Abduction  - 3 x daily - 7 x weekly - 1 sets - 3 reps - Doorway Pec Stretch at 90 Degrees Abduction  - 3 x daily - 7 x weekly - 1 sets - 3 reps - 30 seconds  hold - Doorway Pec Stretch at 120 Degrees Abduction  - 3 x daily - 7 x weekly - 1 sets - 3 reps - 30 second hold  hold - Standing Shoulder Internal Rotation Stretch with Towel  - 2 x daily - 7 x weekly - 1 sets - 3-5 reps - 15-20 sec  hold - Seated Shoulder External Rotation AAROM with Cane and Hand in Neutral  - 2 x daily - 7 x weekly - 1 sets - 5-10 reps - 5-10 sec  hold - Standing Bilateral Low Shoulder Row with Anchored Resistance  - 2 x daily - 7 x weekly - 1-3 sets - 10 reps - 2-3 sec  hold - Drawing Bow  - 1 x daily - 7 x weekly - 1 sets - 10 reps - 3 sec  hold - Supine PNF D2 Flexion with Resistance  - 1 x daily - 7 x weekly - 3 sets - 10 reps - Shoulder External Rotation with Anchored Resistance  - 2 x daily - 7 x weekly - 1-2 sets - 10 reps - 3 sec  hold - Shoulder Internal Rotation with Resistance  - 2 x daily - 7 x  weekly - 2 sets - 10 reps - 3 sec  hold - Shoulder Flexion Serratus Activation with Resistance  - 1 x daily - 7 x weekly - 1 sets - 10 reps - 3-5 sec  hold - Seated High Shoulder Row with Anchored Resistance  - 2 x daily - 7 x weekly - 1 sets - 10 reps - 3 sec  hold - Wall Push Up  - 1 x daily - 7 x weekly - 1-3 sets - 10 reps - 3 sec  hold - Plank on Counter  - 2 x daily - 7 x weekly - 1 sets - 3 reps - 30 sec  hold - Standing Lat Pull Down with Resistance - Elbows Bent  - 1 x daily - 7 x weekly - 1 sets - 10 reps - 3 sec  hold - Child's  Pose Stretch  - 1 x daily - 7 x weekly - 1 sets - 3 reps - 30 sec  hold - Shoulder Overhead Press in Flexion with Dumbbells  - 1 x daily - 7 x weekly - 1 sets - 10 reps - 3 sec  hold - Standing Bent Over Single Arm Shoulder Row  - 1 x daily - 7 x weekly - 2-3 sets - 10 reps - 3 sec  hold - Isometric Shoulder External Rotation in Abduction with Ball at Wall  - 1 x daily - 7 x weekly - 1-2 min hold - Quadruped Marathon Oil with Resistance  - 1 x daily - 7 x weekly - 1 sets - 10 reps - 3 sec  hold - Wall Angels  - 2 x daily - 7 x weekly - 1 sets - 10 reps - 2-3 sec  hold  ASSESSMENT:  CLINICAL IMPRESSION: 12/05/21:  Some soreness in the Lt shoulder following end range stretching and some work/exercises. Patient continues to progress with shoulder rehab post MUA. He continues with improving shoulder ROM and strength. Continues to have tightness in shoulder extension, IR and abduction at end ranges. Progressing with strengthening exercises.     GOALS: Goals reviewed with patient? Yes   LONG TERM GOALS: Target date: 01/30/2022  (Remove Blue Hyperlink)  Patient demonstrates full PROM Lt shoulder  Baseline:  Goal status: INITIAL  2.  Patient demonstrates AROM Lt shoulder to ~ equal to AROM Rt shoulder - with good scapular control  Baseline:  Goal status: INITIAL  3.  5/5 strength Rt shoulder  Baseline:  Goal status: INITIAL  4.  Patient reports return normal functional activities with minimal to no Lt shoulder pain  Baseline:  Goal status: INITIAL  5.  Independent in HEP  Baseline:  Goal status: INITIAL  6.  Improve functional limitation score to 59 Baseline:  Goal status: INITIAL   PLAN: PT FREQUENCY: 3x/week  PT DURATION: 6 weeks  PLANNED INTERVENTIONS: Therapeutic exercises, Therapeutic activity, Neuromuscular re-education, Patient/Family education, Self Care, Joint mobilization, Aquatic Therapy, Dry Needling, Electrical stimulation, Cryotherapy, Moist heat, Taping,  Vasopneumatic device, Ultrasound, Manual therapy, and Re-evaluation  PLAN FOR NEXT SESSION: Review and progress HEP, progressing with patient doing AAROM to PROM progressing to active exercise and resistive exercises as tolerated. Focus on ROM  and strength post MUA with I & D Lt shoulder 10/31/21. Progressing with strengthening and stabilization. Manual work and modalities as indicated.    Val Riles, PT, MPH  12/05/2021, 8:43 AM

## 2021-12-07 ENCOUNTER — Encounter: Payer: Self-pay | Admitting: Rehabilitative and Restorative Service Providers"

## 2021-12-07 ENCOUNTER — Ambulatory Visit: Payer: 59 | Admitting: Rehabilitative and Restorative Service Providers"

## 2021-12-07 DIAGNOSIS — M25512 Pain in left shoulder: Secondary | ICD-10-CM | POA: Diagnosis not present

## 2021-12-07 DIAGNOSIS — M25612 Stiffness of left shoulder, not elsewhere classified: Secondary | ICD-10-CM

## 2021-12-07 DIAGNOSIS — G8929 Other chronic pain: Secondary | ICD-10-CM

## 2021-12-07 DIAGNOSIS — M6281 Muscle weakness (generalized): Secondary | ICD-10-CM

## 2021-12-07 NOTE — Therapy (Signed)
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)    Diabetes (HCC) 09/09/2017   Dyslipidemia 07/22/2014   GERD (gastroesophageal reflux disease)    Past Surgical History:  Procedure Laterality Date   KIDNEY DONATION Right    Patient Active Problem List   Diagnosis Date Noted   Well adult exam 08/16/2021   Labral tear of shoulder 07/17/2021   Type 2 diabetes mellitus without complications (HCC) 09/09/2017   Achilles tendonitis, bilateral 09/13/2016   Absence of kidney 07/22/2014   Dyslipidemia 07/22/2014   Acid reflux 07/22/2014   Avitaminosis D 07/22/2014    PCP: Dr Jones Broom  REFERRING PROVIDER: Dr Everrett Coombe  REFERRING DIAG: Lt shoulder pain   THERAPY DIAG:  Chronic left shoulder pain  Stiffness of left shoulder, not elsewhere classified  Muscle weakness (generalized)  Rationale for Evaluation and Treatment Rehabilitation  ONSET DATE: MUA 10/31/21  SUBJECTIVE:                                                                                                                                                                                      SUBJECTIVE STATEMENT: 12/07/21:  RTD - 12/13/21 Patient continues to have some pain on an intermittent basis. Some soreness in the shoulder and notices a tremor at times. His shoulder is doing well overall. Still tight at end ranges and will have some pain with certain positions.    PERTINENT HISTORY: See above. Patient also reports that he is pre-diabetic  PAIN:  12/07/21: Are you having pain? Yes: NPRS scale: 0/10 Pain location: shoulder  Pain description: no pain some soreness  Aggravating factors: lifting; using Relieving factors: ice    PATIENT GOALS use Lt UE normally again   OBJECTIVE:     UPPER  EXTREMITY ROM:   Passive ROM Right eval Right  11/23/21 Left eval Left  11/07/21 Left  11/23/21  Shoulder flexion 180  171 175 180  Shoulder extension  82 60 70 72  Shoulder abduction   170 180 180  Shoulder adduction       Shoulder internal rotation   35 in scapular plane 67 in scapular plane    Shoulder external rotation   90 in scapular plane 95 in scapular plane    Elbow flexion   WNL's    Elbow extension   WNL's     Wrist flexion   WNL's    Wrist extension   WNL's    Wrist ulnar deviation  WNL's    Wrist radial deviation   WNL's    Wrist pronation   WNL's    Wrist supination   WNL's    (Blank rows = not tested)     PALPATION:  WFL's    TODAY'S TREATMENT:  12/07/21 Therex: Pulley - flexion 10 sec x 10  Pulley scaption 10 x 10 Pulley horiz ab/add in mid range  UBE L6 x 4 min alt fwd/back ea minute Prone shoulder flexion end range Lt only and bilat  Standing shoulder flexion end range bilat x 20-25 reps   IR with strap w/ball ant Lt shd 20 sec x 5 reps Doorway stretch 3 positions x 30 each Ball release in standing lateral scap border UE in ER Sidelying sleeper stretch with noodle 30 sec x 3  Push up blocks press ups sitting x 10 reps   Exercises in clinic and at home:  Lat pull blue TB x 10 Row blue TB x 10 Bow and arrow blue TB x 10 each side ER blue TB x 10 IR blue TB x 10 Wall push up 4" ball Lt hand x 10  Wall push up with clap x 10  Serratus red TB x 10 High row with ER yellow x 10 reps  Counter plank 60 sec x 2 reps Bent row 10# KB x 20  Overhead press bamboo pole w/ 2.5# each side  ER w/back to wall, ball on dorsum of hand ~ 1-2 min Wall angel x 10 Therex ball on wall stretch into flexion Therex ball on wall bouncing ball on wall Therex ball on wall active shoulder flexion end range Childs pose hands on bolster 30-60 sec hold x 2   Manual: PROM shoulder and scapula mobs all planes supine  Neuromuscular re-ed pt sidelying reaching fwd/rolling  ball fwd to assist/support Lt UE   Modalities: None today  PATIENT EDUCATION: Education details: POC; HEP; ICE Person educated: Patient and Spouse Education method: Programmer, multimedia, Demonstration, Tactile cues, and Verbal cues Education comprehension: verbalized understanding, returned demonstration, verbal cues required, tactile cues required, and needs further education   HOME EXERCISE PROGRAM:  Lying on Rt side Pull Lt shoulder blade down and back Reach forward with Lt arm (rolling ball for support of Lt arm - basketball side of slightly larger) 15-20 reps    Sitting pillow case under Lt armpit  Helper pulls up and around  Like a comma  5-10 times   Access Code: [email or web address] URL: [email or web address] Date: 11/23/2021 Prepared by: Corlis Leak  Exercises - Doorway Pec Stretch at 60 Degrees Abduction  - 3 x daily - 7 x weekly - 1 sets - 3 reps - Doorway Pec Stretch at 90 Degrees Abduction  - 3 x daily - 7 x weekly - 1 sets - 3 reps - 30 seconds  hold - Doorway Pec Stretch at 120 Degrees Abduction  - 3 x daily - 7 x weekly - 1 sets - 3 reps - 30 second hold  hold - Standing Shoulder Internal Rotation Stretch with Towel  - 2 x daily - 7 x weekly - 1 sets - 3-5 reps - 15-20 sec  hold - Seated Shoulder External Rotation AAROM with Cane and Hand in Neutral  - 2 x daily - 7 x weekly - 1 sets - 5-10 reps - 5-10 sec  hold - Standing Bilateral Low Shoulder Row with Anchored Resistance  - 2 x daily - 7 x weekly - 1-3 sets - 10 reps - 2-3 sec  hold - Drawing Bow  - 1 x daily - 7 x weekly - 1 sets - 10 reps - 3 sec  hold - Supine PNF D2 Flexion with Resistance  - 1 x daily - 7 x weekly - 3 sets - 10 reps - Shoulder External Rotation with Anchored Resistance  - 2 x daily - 7 x weekly - 1-2 sets - 10 reps - 3 sec  hold - Shoulder Internal Rotation with Resistance  - 2 x daily - 7 x weekly - 2 sets - 10 reps - 3 sec  hold - Shoulder Flexion Serratus Activation with Resistance  - 1 x  daily - 7 x weekly - 1 sets - 10 reps - 3-5 sec  hold - Seated High Shoulder Row with Anchored Resistance  - 2 x daily - 7 x weekly - 1 sets - 10 reps - 3 sec  hold - Wall Push Up  - 1 x daily - 7 x weekly - 1-3 sets - 10 reps - 3 sec  hold - Plank on Counter  - 2 x daily - 7 x weekly - 1 sets - 3 reps - 30 sec  hold - Standing Lat Pull Down with Resistance - Elbows Bent  - 1 x daily - 7 x weekly - 1 sets - 10 reps - 3 sec  hold - Child's Pose Stretch  - 1 x daily - 7 x weekly - 1 sets - 3 reps - 30 sec  hold - Shoulder Overhead Press in Flexion with Dumbbells  - 1 x daily - 7 x weekly - 1 sets - 10 reps - 3 sec  hold - Standing Bent Over Single Arm Shoulder Row  - 1 x daily - 7 x weekly - 2-3 sets - 10 reps - 3 sec  hold - Isometric Shoulder External Rotation in Abduction with Ball at Wall  - 1 x daily - 7 x weekly - 1-2 min hold - Quadruped [employer] with Resistance  - 1 x daily - 7 x weekly - 1 sets - 10 reps - 3 sec  hold - Wall Angels  - 2 x daily - 7 x weekly - 1 sets - 10 reps - 2-3 sec  hold  ASSESSMENT:  CLINICAL IMPRESSION: 12/07/21:  Some soreness in the Lt shoulder following end range stretching and some work/exercises in the clinic. Patient continues to progress with shoulder rehab post MUA. He continues with improving shoulder ROM and strength. Continues to have tightness in shoulder extension, IR and abduction at end ranges. Progressing with strengthening exercises.     GOALS: Goals reviewed with patient? Yes   LONG TERM GOALS: Target date: 02/01/2022  (Remove Blue Hyperlink)  Patient demonstrates full PROM Lt shoulder  Baseline:  Goal status: INITIAL  2.  Patient demonstrates AROM Lt shoulder to ~ equal to AROM Rt shoulder - with good scapular control  Baseline:  Goal status: INITIAL  3.  5/5 strength Rt shoulder  Baseline:  Goal status: INITIAL  4.  Patient reports return normal functional activities with minimal to no Lt shoulder pain  Baseline:  Goal status:  INITIAL  5.  Independent in HEP  Baseline:  Goal status: INITIAL  6.  Improve functional limitation score to 59 Baseline:  Goal status: INITIAL   PLAN: PT FREQUENCY: 3x/week  PT DURATION: 6 weeks  PLANNED INTERVENTIONS: Therapeutic exercises, Therapeutic activity, Neuromuscular re-education, Patient/Family education, Self Care, Joint mobilization, Aquatic Therapy, Dry Needling, Electrical stimulation, Cryotherapy, Moist  heat, Taping, Vasopneumatic device, Ultrasound, Manual therapy, and Re-evaluation  PLAN FOR NEXT SESSION: Review and progress HEP, progressing with patient doing AAROM to PROM progressing to active exercise and resistive exercises as tolerated. Focus on ROM  and strength post MUA with I & D Lt shoulder 10/31/21. Progressing with strengthening and stabilization. Manual work and modalities as indicated.  Note to MD at next visit.   Val Riles, PT, MPH  12/07/2021, 8:43 AM

## 2021-12-07 NOTE — Patient Instructions (Signed)
Lying on Rt side Pull Lt shoulder blade down and back Reach forward with Lt arm (rolling ball for support of Lt arm - basketball side of slightly larger) 15-20 reps   Sitting pillow case under Lt armpit  Helper pulls up and around  Like a comma  5-10 times

## 2021-12-12 ENCOUNTER — Encounter: Payer: Self-pay | Admitting: Rehabilitative and Restorative Service Providers"

## 2021-12-12 ENCOUNTER — Ambulatory Visit: Payer: 59 | Admitting: Rehabilitative and Restorative Service Providers"

## 2021-12-12 DIAGNOSIS — M6281 Muscle weakness (generalized): Secondary | ICD-10-CM

## 2021-12-12 DIAGNOSIS — M25512 Pain in left shoulder: Secondary | ICD-10-CM | POA: Diagnosis not present

## 2021-12-12 DIAGNOSIS — M25612 Stiffness of left shoulder, not elsewhere classified: Secondary | ICD-10-CM

## 2021-12-12 DIAGNOSIS — G8929 Other chronic pain: Secondary | ICD-10-CM

## 2021-12-12 NOTE — Therapy (Signed)
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)    Diabetes ([place]) 09/09/2017   Dyslipidemia 07/22/2014   GERD (gastroesophageal reflux disease)    Past Surgical History:  Procedure Laterality Date   KIDNEY DONATION Right    Patient Active Problem List   Diagnosis Date Noted   Well adult exam 08/16/2021   Labral tear of shoulder 07/17/2021   Type 2 diabetes mellitus without complications ([place]) 55/73/2202   Achilles tendonitis, bilateral 09/13/2016   Absence of kidney 07/22/2014   Dyslipidemia 07/22/2014   Acid reflux 07/22/2014   Avitaminosis D 07/22/2014    PCP: Dr Tania Ade  REFERRING PROVIDER: Dr Luetta Nutting  REFERRING DIAG: Lt shoulder pain   THERAPY DIAG:  Chronic left shoulder pain  Stiffness of left shoulder, not elsewhere classified  Muscle weakness (generalized)  Rationale for Evaluation and Treatment Rehabilitation  ONSET DATE: MUA 10/31/21  SUBJECTIVE:                                                                                                                                                                                      SUBJECTIVE STATEMENT: 12/12/21:  RTD - 12/13/21 Patient continues to have some pain on an intermittent basis. Some soreness in the shoulder and notices a tremor at times. His shoulder is doing well overall. Still tight at end ranges and will have some pain with certain positions.    PERTINENT HISTORY: Chronic shoulder pain. Pre-diabetic  PAIN:  12/12/21: Are you having pain? Yes: NPRS scale: 0/10 Pain location: shoulder  Pain description: no pain some soreness  Aggravating factors: lifting; using Relieving factors: ice    PATIENT GOALS use Lt UE normally again   OBJECTIVE:   Upper extremity ROM  Active  ROM;  Shoulder flexion - 172 Shoulder extension - 67 Shoulder abduction - 180 Shoulder IR (shd/elbow - 90/90) - 62 Shoulder ER (shd/elbow - 90/90) - 95  UPPER EXTREMITY ROM:   Passive ROM Right eval Right  11/23/21 Left eval Left  11/07/21 Left  11/23/21 Left 12/11/21  Shoulder flexion 180  171 175 180 180  Shoulder extension  82 60 70 72 72  Shoulder abduction   170 180 180 180  Shoulder adduction        Shoulder internal rotation   35 in scapular plane 67 in scapular plane   67 in scapular plane at side   Shoulder external rotation   90 in scapular plane 95 in scapular plane  98 in scapular plane shd/elbow 90/90   Strength: Lt shoulder 5/5 flexion, abduction, ER, IR Middle trap, lower trap 5-/5              FOTO: 83  PALPATION:  WFL's    TODAY'S TREATMENT:  12/12/21 Therex: Pulley - flexion 10 sec x 10  Pulley scaption 10 x 10 Pulley horiz ab/add in mid range  UBE L6 x 4 min alt fwd/back ea minute AAROM using dowel all planes x 5 reps each Ball on wall stepping under ball 30 sec x 3 reps  Ball on wall bouncing overhead 1 min x 3 reps  Prone shoulder flexion end range Lt only and bilat  Standing shoulder flexion end range bilat x 20-25 reps   Doorway stretch 3 positions x 30 each x 2 reps e Ball release in standing lateral scap border UE in ER   Other exercises in clinic and at home:  Lat pull blue TB x 10 Row blue TB x 10 Bow and arrow blue TB x 10 each side ER blue TB x 10 IR blue TB x 10 Wall push up 4" ball Lt hand x 10  Wall push up with clap x 10  Serratus red TB x 10 High row with ER yellow x 10 reps  Counter plank 60 sec x 2 reps Bent row 10# KB x 20  Overhead press bamboo pole w/ 2.5# each side  ER w/back to wall, ball on dorsum of hand ~ 1-2 min Wall angel x 10 Therex ball on wall stretch into flexion Therex ball on wall bouncing ball on wall Therex ball on wall active shoulder flexion end range Childs pose hands on bolster 30-60 sec hold x 2   IR with strap w/ball ant Lt shd 20 sec x 5 reps Sidelying sleeper stretch with noodle 30 sec x 3  Push up blocks press ups sitting x 10 reps   Manual: 12/07/21: PROM shoulder and scapula mobs all planes supine  Neuromuscular re-ed pt sidelying reaching fwd/rolling ball fwd to assist/support Lt UE   Modalities: None today  PATIENT EDUCATION: Education details: POC; HEP; ICE Person educated: Patient and Spouse Education method: Explanation, Demonstration, Tactile cues, and Verbal cues Education comprehension: verbalized understanding, returned demonstration, verbal cues required, tactile cues required, and needs further education   HOME EXERCISE PROGRAM:  Access Code: [email or web address] URL: [email or web address] Date: 11/23/2021 Prepared by: Corlis Leak Exercises - Doorway Pec Stretch at 60 Degrees Abduction  - 3 x daily - 7 x weekly - 1 sets - 3 reps - Doorway Pec Stretch at 90 Degrees Abduction  - 3 x daily - 7 x weekly - 1 sets - 3 reps - 30 seconds  hold - Doorway Pec Stretch at 120 Degrees Abduction  - 3 x daily - 7 x weekly - 1 sets - 3 reps - 30 second hold  hold - Standing Shoulder Internal Rotation Stretch with Towel  - 2 x daily - 7 x weekly - 1 sets - 3-5 reps - 15-20 sec  hold - Seated Shoulder External Rotation AAROM with Cane and Hand in Neutral  - 2 x daily - 7 x weekly - 1 sets - 5-10 reps - 5-10 sec  hold - Standing Bilateral Low Shoulder Row with Anchored Resistance  - 2 x daily - 7 x weekly - 1-3 sets - 10 reps - 2-3 sec  hold - Drawing Bow  - 1 x daily - 7 x weekly - 1 sets -  10 reps - 3 sec  hold - Supine PNF D2 Flexion with Resistance  - 1 x daily - 7 x weekly - 3 sets - 10 reps - Shoulder External Rotation with Anchored Resistance  - 2 x daily - 7 x weekly - 1-2 sets - 10 reps - 3 sec  hold - Shoulder Internal Rotation with Resistance  - 2 x daily - 7 x weekly - 2 sets - 10 reps - 3 sec  hold - Shoulder Flexion Serratus Activation with Resistance  - 1 x  daily - 7 x weekly - 1 sets - 10 reps - 3-5 sec  hold - Seated High Shoulder Row with Anchored Resistance  - 2 x daily - 7 x weekly - 1 sets - 10 reps - 3 sec  hold - Wall Push Up  - 1 x daily - 7 x weekly - 1-3 sets - 10 reps - 3 sec  hold - Plank on Counter  - 2 x daily - 7 x weekly - 1 sets - 3 reps - 30 sec  hold - Standing Lat Pull Down with Resistance - Elbows Bent  - 1 x daily - 7 x weekly - 1 sets - 10 reps - 3 sec  hold - Child's Pose Stretch  - 1 x daily - 7 x weekly - 1 sets - 3 reps - 30 sec  hold - Shoulder Overhead Press in Flexion with Dumbbells  - 1 x daily - 7 x weekly - 1 sets - 10 reps - 3 sec  hold - Standing Bent Over Single Arm Shoulder Row  - 1 x daily - 7 x weekly - 2-3 sets - 10 reps - 3 sec  hold - Isometric Shoulder External Rotation in Abduction with Ball at Wall  - 1 x daily - 7 x weekly - 1-2 min hold - Quadruped Marathon OilLawn Mower with Resistance  - 1 x daily - 7 x weekly - 1 sets - 10 reps - 3 sec  hold - Wall Angels  - 2 x daily - 7 x weekly - 1 sets - 10 reps - 2-3 sec  hold  ASSESSMENT:  CLINICAL IMPRESSION: 12/12/21:  Excellent progress with shoulder rehab post MUA with debridement. Some soreness in the Lt shoulder following end range stretching and work/exercises in the clinic. He demonstrates continued increases in shoulder ROM and strength. Continues to have tightness in shoulder extension, IR and abduction at end ranges. Progressing well with strengthening exercises.     GOALS: Goals reviewed with patient? Yes   LONG TERM GOALS: Target date: 02/06/2022  (Remove Blue Hyperlink)  Patient demonstrates full PROM Lt shoulder  Baseline:  Goal status: INITIAL  2.  Patient demonstrates AROM Lt shoulder to ~ equal to AROM Rt shoulder - with good scapular control  Baseline:  Goal status: INITIAL  3.  5/5 strength Rt shoulder  Baseline:  Goal status: INITIAL  4.  Patient reports return normal functional activities with minimal to no Lt shoulder pain   Baseline:  Goal status: INITIAL  5.  Independent in HEP  Baseline:  Goal status: INITIAL  6.  Improve functional limitation score to 59 Baseline:  Goal status: INITIAL   PLAN: PT FREQUENCY: 3x/week  PT DURATION: 6 weeks  PLANNED INTERVENTIONS: Therapeutic exercises, Therapeutic activity, Neuromuscular re-education, Patient/Family education, Self Care, Joint mobilization, Aquatic Therapy, Dry Needling, Electrical stimulation, Cryotherapy, Moist heat, Taping, Vasopneumatic device, Ultrasound, Manual therapy, and Re-evaluation  PLAN FOR NEXT SESSION: Review and  progress HEP, progressing with patient doing AAROM to PROM progressing to active exercise and resistive exercises as tolerated. Focus on ROM  and strength post MUA with I & D Lt shoulder 10/31/21. Progressing with strengthening and stabilization. Manual work and modalities as indicated.     Val Riles, PT, MPH  12/12/2021, 8:05 AM

## 2021-12-14 ENCOUNTER — Ambulatory Visit: Payer: 59 | Admitting: Rehabilitative and Restorative Service Providers"

## 2021-12-14 ENCOUNTER — Encounter: Payer: Self-pay | Admitting: Rehabilitative and Restorative Service Providers"

## 2021-12-14 DIAGNOSIS — M25612 Stiffness of left shoulder, not elsewhere classified: Secondary | ICD-10-CM

## 2021-12-14 DIAGNOSIS — G8929 Other chronic pain: Secondary | ICD-10-CM

## 2021-12-14 DIAGNOSIS — M6281 Muscle weakness (generalized): Secondary | ICD-10-CM

## 2021-12-14 DIAGNOSIS — M25512 Pain in left shoulder: Secondary | ICD-10-CM | POA: Diagnosis not present

## 2021-12-14 NOTE — Therapy (Signed)
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)    Diabetes ([place]) 09/09/2017   Dyslipidemia 07/22/2014   GERD (gastroesophageal reflux disease)    Past Surgical History:  Procedure Laterality Date   KIDNEY DONATION Right    Patient Active Problem List   Diagnosis Date Noted   Well adult exam 08/16/2021   Labral tear of shoulder 07/17/2021   Type 2 diabetes mellitus without complications ([place]) [id number]   Achilles tendonitis, bilateral 09/13/2016   Absence of kidney 07/22/2014   Dyslipidemia 07/22/2014   Acid reflux 07/22/2014   Avitaminosis D 07/22/2014    PCP: Dr Tania Ade  REFERRING PROVIDER: Dr Luetta Nutting  REFERRING DIAG: Lt shoulder pain   THERAPY DIAG:  Chronic left shoulder pain  Stiffness of left shoulder, not elsewhere classified  Muscle weakness (generalized)  Rationale for Evaluation and Treatment Rehabilitation  ONSET DATE: MUA 10/31/21  SUBJECTIVE:                                                                                                                                                                                      SUBJECTIVE STATEMENT: 12/14/21:  RTD - 12/13/21 Marshall saw MD yesterday and discussed his progress and ask questions about his healing process and what he should expect. MD feels Josephus will continue to benefit from continued PT for a few additional visits. Patient continues to have some pain on an intermittent basis and soreness. Some soreness in the shoulder and notices a tremor at times. His shoulder is doing well overall. Still tight at end ranges and will have some pain with certain positions.    PERTINENT HISTORY: Chronic shoulder pain. Pre-diabetic  PAIN:  12/14/21: Are you having pain? Yes: NPRS scale: 0/10 Pain  location: shoulder  Pain description: no pain some soreness  Aggravating factors: lifting; using Relieving factors: ice    PATIENT GOALS use Lt UE normally again   OBJECTIVE:   Upper extremity ROM  Active ROM;  Shoulder flexion - 172 Shoulder extension - 67 Shoulder abduction - 180 Shoulder IR (shd/elbow - 90/90) - 62 Shoulder ER (shd/elbow - 90/90) - 95  UPPER EXTREMITY ROM:   Passive ROM Right eval Right  11/23/21 Left eval Left  11/07/21 Left  11/23/21 Left 12/11/21  Shoulder flexion 180  171 175 180 180  Shoulder extension  82 60 70 72 72  Shoulder abduction   170 180 180 180  Shoulder adduction  Shoulder internal rotation   35 in scapular plane 67 in scapular plane   67 in scapular plane at side   Shoulder external rotation   90 in scapular plane 95 in scapular plane   98 in scapular plane shd/elbow 90/90   Strength: Lt shoulder 5/5 flexion, abduction, ER, IR Middle trap, lower trap 5-/5              FOTO: 83  PALPATION:  WFL's    TODAY'S TREATMENT:  12/14/21 Therex: Pulley - flexion 10 sec x 10  Pulley scaption 10 x 10 Pulley horiz ab/add in mid range  UBE L6 x 4 min alt fwd/back ea minute Bodyblade 30 sec x 2; 60 sec x 1 Lt flexion Bodyblade 30 sec x 2 bilat waist to overhead Row black TB x 10 x 2 sets Bow and arrow black TB x 10 each side  Push up blocks press ups sitting x 10 reps  Ball on wall stepping under ball 60 sec x 1 reps  Ball on wall bouncing overhead 1 min x 2 reps  Standing shoulder flexion end range bilat x 20-25 reps   Doorway stretch 3 positions x 30 each x 2 reps    IR with strap w/ball ant Lt shd 20 sec x 5 reps  Other exercises in clinic and at home:  Lat pull blue TB x 10 Row blue TB x 10 Bow and arrow blue TB x 10 each side ER blue TB x 10 IR blue TB x 10 Wall push up 4" ball Lt hand x 10  Wall push up with clap x 10  Serratus red TB x 10 High row with ER yellow x 10 reps  Counter plank 60 sec x 2 reps Bent row 10#  KB x 20  Overhead press bamboo pole w/ 2.5# each side  ER w/back to wall, ball on dorsum of hand ~ 1-2 min Wall angel x 10 Therex ball on wall stretch into flexion Therex ball on wall bouncing ball on wall Therex ball on wall active shoulder flexion end range Childs pose hands on bolster 30-60 sec hold x 2  Sidelying sleeper stretch with noodle 30 sec x 3  AAROM using dowel all planes x 5 reps each Prone shoulder flexion end range Lt only and bilat  Ball release in standing lateral scap border UE in ER  Manual: 12/07/21: PROM shoulder and scapula mobs all planes supine  Neuromuscular re-ed pt sidelying reaching fwd/rolling ball fwd to assist/support Lt UE   Modalities: None today  PATIENT EDUCATION: Education details: POC; HEP; ICE Person educated: Patient and Spouse Education method: Explanation, Demonstration, Tactile cues, and Verbal cues Education comprehension: verbalized understanding, returned demonstration, verbal cues required, tactile cues required, and needs further education   HOME EXERCISE PROGRAM:  Access Code: [email or web address] URL: [email or web address] Date: 11/23/2021 Prepared by: Gillermo Murdoch Exercises - Doorway Pec Stretch at 60 Degrees Abduction  - 3 x daily - 7 x weekly - 1 sets - 3 reps - Doorway Pec Stretch at 90 Degrees Abduction  - 3 x daily - 7 x weekly - 1 sets - 3 reps - 30 seconds  hold - Doorway Pec Stretch at 120 Degrees Abduction  - 3 x daily - 7 x weekly - 1 sets - 3 reps - 30 second hold  hold - Standing Shoulder Internal Rotation Stretch with Towel  - 2 x daily - 7 x weekly - 1 sets - 3-5 reps - 15-20 sec  hold -  Seated Shoulder External Rotation AAROM with Cane and Hand in Neutral  - 2 x daily - 7 x weekly - 1 sets - 5-10 reps - 5-10 sec  hold - Standing Bilateral Low Shoulder Row with Anchored Resistance  - 2 x daily - 7 x weekly - 1-3 sets - 10 reps - 2-3 sec  hold - Drawing Bow  - 1 x daily - 7 x weekly - 1 sets - 10 reps - 3 sec   hold - Supine PNF D2 Flexion with Resistance  - 1 x daily - 7 x weekly - 3 sets - 10 reps - Shoulder External Rotation with Anchored Resistance  - 2 x daily - 7 x weekly - 1-2 sets - 10 reps - 3 sec  hold - Shoulder Internal Rotation with Resistance  - 2 x daily - 7 x weekly - 2 sets - 10 reps - 3 sec  hold - Shoulder Flexion Serratus Activation with Resistance  - 1 x daily - 7 x weekly - 1 sets - 10 reps - 3-5 sec  hold - Seated High Shoulder Row with Anchored Resistance  - 2 x daily - 7 x weekly - 1 sets - 10 reps - 3 sec  hold - Wall Push Up  - 1 x daily - 7 x weekly - 1-3 sets - 10 reps - 3 sec  hold - Plank on Counter  - 2 x daily - 7 x weekly - 1 sets - 3 reps - 30 sec  hold - Standing Lat Pull Down with Resistance - Elbows Bent  - 1 x daily - 7 x weekly - 1 sets - 10 reps - 3 sec  hold - Child's Pose Stretch  - 1 x daily - 7 x weekly - 1 sets - 3 reps - 30 sec  hold - Shoulder Overhead Press in Flexion with Dumbbells  - 1 x daily - 7 x weekly - 1 sets - 10 reps - 3 sec  hold - Standing Bent Over Single Arm Shoulder Row  - 1 x daily - 7 x weekly - 2-3 sets - 10 reps - 3 sec  hold - Isometric Shoulder External Rotation in Abduction with Ball at Wall  - 1 x daily - 7 x weekly - 1-2 min hold - Quadruped [employer] with Resistance  - 1 x daily - 7 x weekly - 1 sets - 10 reps - 3 sec  hold - Wall Angels  - 2 x daily - 7 x weekly - 1 sets - 10 reps - 2-3 sec  hold  ASSESSMENT:  CLINICAL IMPRESSION: 12/14/21:  Some continued soreness in the Lt shoulder following end range stretching and work/exercises in the clinic. He demonstrates continued increases in shoulder ROM and strength. Noted continued tightness in shoulder extension, IR and abduction at end ranges. Progressing well with strengthening exercises.     GOALS: Goals reviewed with patient? Yes   LONG TERM GOALS: Target date: 02/08/2022  (Remove Blue Hyperlink)  Patient demonstrates full PROM Lt shoulder  Baseline:  Goal status:  INITIAL  2.  Patient demonstrates AROM Lt shoulder to ~ equal to AROM Rt shoulder - with good scapular control  Baseline:  Goal status: INITIAL  3.  5/5 strength Rt shoulder  Baseline:  Goal status: INITIAL  4.  Patient reports return normal functional activities with minimal to no Lt shoulder pain  Baseline:  Goal status: INITIAL  5.  Independent in HEP  Baseline:  Goal status: INITIAL  6.  Improve functional limitation score to 59 Baseline:  Goal status: INITIAL   PLAN: PT FREQUENCY: 3x/week  PT DURATION: 6 weeks  PLANNED INTERVENTIONS: Therapeutic exercises, Therapeutic activity, Neuromuscular re-education, Patient/Family education, Self Care, Joint mobilization, Aquatic Therapy, Dry Needling, Electrical stimulation, Cryotherapy, Moist heat, Taping, Vasopneumatic device, Ultrasound, Manual therapy, and Re-evaluation  PLAN FOR NEXT SESSION: Review and progress HEP, progressing with patient doing AAROM to PROM progressing to active exercise and resistive exercises as tolerated. Focus on ROM  and strength post MUA with I & D Lt shoulder 10/31/21. Progressing with strengthening and stabilization. Manual work and modalities as indicated.     Everardo All, PT, MPH  12/14/2021, 8:44 AM

## 2021-12-19 ENCOUNTER — Encounter: Payer: 59 | Admitting: Rehabilitative and Restorative Service Providers"

## 2021-12-20 ENCOUNTER — Encounter: Payer: Self-pay | Admitting: Rehabilitative and Restorative Service Providers"

## 2021-12-20 ENCOUNTER — Ambulatory Visit: Payer: 59 | Admitting: Rehabilitative and Restorative Service Providers"

## 2021-12-20 DIAGNOSIS — M25612 Stiffness of left shoulder, not elsewhere classified: Secondary | ICD-10-CM

## 2021-12-20 DIAGNOSIS — G8929 Other chronic pain: Secondary | ICD-10-CM

## 2021-12-20 DIAGNOSIS — M6281 Muscle weakness (generalized): Secondary | ICD-10-CM

## 2021-12-20 DIAGNOSIS — M25512 Pain in left shoulder: Secondary | ICD-10-CM | POA: Diagnosis not present

## 2021-12-20 NOTE — Therapy (Signed)
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)    Diabetes ([facility]) 09/09/2017   Dyslipidemia 07/22/2014   GERD (gastroesophageal reflux disease)    Past Surgical History:  Procedure Laterality Date   KIDNEY DONATION Right    Patient Active Problem List   Diagnosis Date Noted   Well adult exam 08/16/2021   Labral tear of shoulder 07/17/2021   Type 2 diabetes mellitus without complications ([facility]) 09/09/2017   Achilles tendonitis, bilateral 09/13/2016   Absence of kidney 07/22/2014   Dyslipidemia 07/22/2014   Acid reflux 07/22/2014   Avitaminosis D 07/22/2014    PCP: Dr Jones Broom  REFERRING PROVIDER: Dr Everrett Coombe  REFERRING DIAG: Lt shoulder pain   THERAPY DIAG:  Chronic left shoulder pain  Stiffness of left shoulder, not elsewhere classified  Muscle weakness (generalized)  Rationale for Evaluation and Treatment Rehabilitation  ONSET DATE: MUA 10/31/21  SUBJECTIVE:                                                                                                                                                                                      SUBJECTIVE STATEMENT: 12/20/21:  Patient continues to have soreness in the Lt shoulder but no pain. His shoulder is doing well overall. Still tight at end ranges and will have some pain with certain positions.    PERTINENT HISTORY: Chronic shoulder pain. Pre-diabetic  PAIN:  12/20/21: Are you having pain? Yes: NPRS scale: 0/10 Pain location: shoulder  Pain description: no pain some soreness  Aggravating factors: lifting; using Relieving factors: ice    PATIENT GOALS use Lt UE normally again   OBJECTIVE:   Upper extremity ROM  Active ROM;  Shoulder flexion - 172 Shoulder extension - 67 Shoulder  abduction - 180 Shoulder IR (shd/elbow - 90/90) - 62 Shoulder ER (shd/elbow - 90/90) - 95  UPPER EXTREMITY ROM:   Passive ROM Right eval Right  11/23/21 Left eval Left  11/07/21 Left  11/23/21 Left 12/11/21  Shoulder flexion 180  171 175 180 180  Shoulder extension  82 60 70 72 72  Shoulder abduction   170 180 180 180  Shoulder adduction        Shoulder internal rotation   35 in scapular plane 67 in scapular plane   67 in scapular plane at side   Shoulder external rotation   90 in scapular plane 95 in scapular plane   98 in scapular plane shd/elbow 90/90   Strength: Lt shoulder  5/5 flexion, abduction, ER, IR Middle trap, lower trap 5-/5              FOTO: 83  PALPATION:  WFL's    TODAY'S TREATMENT:  12/20/21 Therex: Pulley - flexion 10 sec x 10  Pulley scaption 10 x 10 Pulley horiz ab/add in mid range  UBE L6 x 5 min alt fwd/back ea minute Bodyblade 60 sec x 2 each flexion Bodyblade 60 sec x 2 bilat waist to overhead Row black TB x 10 x 2 sets Bow and arrow black TB x 10 each side  Push up blocks press ups sitting x 10 reps  Ball on wall stepping under ball 60 sec x 1 reps  Ball on wall bouncing overhead 1 min x 2 reps  Standing shoulder flexion end range bilat x 20-25 reps   Doorway stretch 3 positions x 30 each x 2 reps    IR with strap w/ball ant Lt shd 20 sec x 5 reps  Other exercises in clinic and at home:  Lat pull blue TB x 10 Row blue TB x 10 Bow and arrow blue TB x 10 each side ER blue TB x 10 IR blue TB x 10 Wall push up 4" ball Lt hand x 10  Wall push up with clap x 10  Serratus red TB x 10 High row with ER yellow x 10 reps  Counter plank 60 sec x 2 reps Bent row 10# KB x 20  Overhead press bamboo pole w/ 2.5# each side  ER w/back to wall, ball on dorsum of hand ~ 1-2 min Wall angel x 10 Therex ball on wall stretch into flexion Therex ball on wall bouncing ball on wall Therex ball on wall active shoulder flexion end range Childs pose hands on  bolster 30-60 sec hold x 2  Sidelying sleeper stretch with noodle 30 sec x 3  AAROM using dowel all planes x 5 reps each Prone shoulder flexion end range Lt only and bilat  Ball release in standing lateral scap border UE in ER  Manual: 12/20/21: PROM shoulder and scapula mobs all planes supine  Neuromuscular re-ed pt sidelying reaching fwd/rolling ball fwd to assist/support Lt UE   Modalities: None today  PATIENT EDUCATION: Education details: POC; HEP; ICE Person educated: Patient and Spouse Education method: Explanation, Demonstration, Tactile cues, and Verbal cues Education comprehension: verbalized understanding, returned demonstration, verbal cues required, tactile cues required, and needs further education   HOME EXERCISE PROGRAM:  Access Code: [email or web address] URL: [email or web address] Date: 11/23/2021 Prepared by: Gillermo Murdoch Exercises - Doorway Pec Stretch at 60 Degrees Abduction  - 3 x daily - 7 x weekly - 1 sets - 3 reps - Doorway Pec Stretch at 90 Degrees Abduction  - 3 x daily - 7 x weekly - 1 sets - 3 reps - 30 seconds  hold - Doorway Pec Stretch at 120 Degrees Abduction  - 3 x daily - 7 x weekly - 1 sets - 3 reps - 30 second hold  hold - Standing Shoulder Internal Rotation Stretch with Towel  - 2 x daily - 7 x weekly - 1 sets - 3-5 reps - 15-20 sec  hold - Seated Shoulder External Rotation AAROM with Cane and Hand in Neutral  - 2 x daily - 7 x weekly - 1 sets - 5-10 reps - 5-10 sec  hold - Standing Bilateral Low Shoulder Row with Anchored Resistance  - 2 x daily - 7 x weekly - 1-3 sets -  10 reps - 2-3 sec  hold - Drawing Bow  - 1 x daily - 7 x weekly - 1 sets - 10 reps - 3 sec  hold - Supine PNF D2 Flexion with Resistance  - 1 x daily - 7 x weekly - 3 sets - 10 reps - Shoulder External Rotation with Anchored Resistance  - 2 x daily - 7 x weekly - 1-2 sets - 10 reps - 3 sec  hold - Shoulder Internal Rotation with Resistance  - 2 x daily - 7 x weekly - 2 sets - 10  reps - 3 sec  hold - Shoulder Flexion Serratus Activation with Resistance  - 1 x daily - 7 x weekly - 1 sets - 10 reps - 3-5 sec  hold - Seated High Shoulder Row with Anchored Resistance  - 2 x daily - 7 x weekly - 1 sets - 10 reps - 3 sec  hold - Wall Push Up  - 1 x daily - 7 x weekly - 1-3 sets - 10 reps - 3 sec  hold - Plank on Counter  - 2 x daily - 7 x weekly - 1 sets - 3 reps - 30 sec  hold - Standing Lat Pull Down with Resistance - Elbows Bent  - 1 x daily - 7 x weekly - 1 sets - 10 reps - 3 sec  hold - Child's Pose Stretch  - 1 x daily - 7 x weekly - 1 sets - 3 reps - 30 sec  hold - Shoulder Overhead Press in Flexion with Dumbbells  - 1 x daily - 7 x weekly - 1 sets - 10 reps - 3 sec  hold - Standing Bent Over Single Arm Shoulder Row  - 1 x daily - 7 x weekly - 2-3 sets - 10 reps - 3 sec  hold - Isometric Shoulder External Rotation in Abduction with Ball at Wall  - 1 x daily - 7 x weekly - 1-2 min hold - Quadruped [employer] with Resistance  - 1 x daily - 7 x weekly - 1 sets - 10 reps - 3 sec  hold - Wall Angels  - 2 x daily - 7 x weekly - 1 sets - 10 reps - 2-3 sec  hold  ASSESSMENT:  CLINICAL IMPRESSION: 12/20/21:  Some continued soreness in the Lt shoulder at times - sometimes following end range stretching and work/exercises at home and in the clinic. He demonstrates continued increases in shoulder ROM and strength. Noted continued tightness in shoulder extension, IR and abduction at end ranges. Progressing well with strengthening exercises.     GOALS: Goals reviewed with patient? Yes   LONG TERM GOALS: Target date: 02/14/2022  (Remove Blue Hyperlink)  Patient demonstrates full PROM Lt shoulder  Baseline:  Goal status: INITIAL  2.  Patient demonstrates AROM Lt shoulder to ~ equal to AROM Rt shoulder - with good scapular control  Baseline:  Goal status: INITIAL  3.  5/5 strength Rt shoulder  Baseline:  Goal status: INITIAL  4.  Patient reports return normal functional  activities with minimal to no Lt shoulder pain  Baseline:  Goal status: INITIAL  5.  Independent in HEP  Baseline:  Goal status: INITIAL  6.  Improve functional limitation score to 59 Baseline:  Goal status: INITIAL   PLAN: PT FREQUENCY: 3x/week  PT DURATION: 6 weeks  PLANNED INTERVENTIONS: Therapeutic exercises, Therapeutic activity, Neuromuscular re-education, Patient/Family education, Self Care, Joint mobilization, Aquatic Therapy, Dry  Needling, Electrical stimulation, Cryotherapy, Moist heat, Taping, Vasopneumatic device, Ultrasound, Manual therapy, and Re-evaluation  PLAN FOR NEXT SESSION: Review and progress HEP, progressing with patient doing AAROM to PROM progressing to active exercise and resistive exercises as tolerated. Focus on ROM  and strength post MUA with I & D Lt shoulder 10/31/21. Progressing with strengthening and stabilization. Manual work and modalities as indicated.     Val Riles, PT, MPH  12/20/2021, 8:01 AM

## 2021-12-21 ENCOUNTER — Encounter: Payer: 59 | Admitting: Rehabilitative and Restorative Service Providers"

## 2021-12-27 ENCOUNTER — Encounter: Payer: Self-pay | Admitting: Rehabilitative and Restorative Service Providers"

## 2021-12-27 ENCOUNTER — Ambulatory Visit: Payer: 59 | Attending: Obstetrics & Gynecology | Admitting: Rehabilitative and Restorative Service Providers"

## 2021-12-27 DIAGNOSIS — M25512 Pain in left shoulder: Secondary | ICD-10-CM | POA: Diagnosis not present

## 2021-12-27 DIAGNOSIS — G8929 Other chronic pain: Secondary | ICD-10-CM | POA: Insufficient documentation

## 2021-12-27 DIAGNOSIS — M25612 Stiffness of left shoulder, not elsewhere classified: Secondary | ICD-10-CM | POA: Insufficient documentation

## 2021-12-27 DIAGNOSIS — M6281 Muscle weakness (generalized): Secondary | ICD-10-CM | POA: Diagnosis present

## 2021-12-27 NOTE — Therapy (Addendum)
OUTPATIENT PHYSICAL THERAPY TREATMENT and DISCHARGE SUMMARY  PHYSICAL THERAPY DISCHARGE SUMMARY  Visits from Start of Care: 16  Current functional level related to goals / functional outcomes: See progress note for discharge status    Remaining deficits: No known deficits    Education / Equipment: HEP    Patient agrees to discharge. Patient goals were met. Patient is being discharged due to meeting the stated rehab goals.  Darius Roberts P. 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)    Diabetes ([place]) 09/09/2017   Dyslipidemia 07/22/2014   GERD (gastroesophageal reflux disease)    Past Surgical History:  Procedure Laterality Date   KIDNEY DONATION Right    Patient Active Problem List   Diagnosis Date Noted   Well adult exam 08/16/2021   Labral tear of shoulder 07/17/2021   Type 2 diabetes mellitus without complications ([place]) 68/34/1962   Achilles tendonitis, bilateral 09/13/2016   Absence of kidney 07/22/2014   Dyslipidemia 07/22/2014   Acid reflux 07/22/2014   Avitaminosis D 07/22/2014    PCP: Dr Tania Ade  REFERRING PROVIDER: Dr Luetta Nutting  REFERRING DIAG: Lt shoulder pain   THERAPY DIAG:  Chronic left shoulder pain  Stiffness of left shoulder, not elsewhere classified  Muscle weakness (generalized)  Rationale for Evaluation and Treatment Rehabilitation  ONSET DATE: MUA 10/31/21  SUBJECTIVE:                                                                                                                                                                                      SUBJECTIVE STATEMENT: 12/27/21:  Accepting that he will have some soreness in his shoulder for some time and knows it will take time. Patient continues to have soreness in the Lt shoulder  but no pain. His shoulder is doing well overall. Still tight at end ranges and will have some pain with certain positions.    PERTINENT HISTORY: Chronic shoulder pain. Pre-diabetic  PAIN:  12/27/21: Are you having pain? Yes: NPRS scale: 0/10 Pain location: shoulder  Pain description: no pain some soreness  Aggravating factors: lifting; using Relieving factors: ice    PATIENT GOALS use Lt UE normally again   OBJECTIVE:   Upper extremity ROM  Active ROM;  Shoulder flexion - 172 Shoulder extension - 67 Shoulder abduction - 180 Shoulder IR (shd/elbow - 90/90) - 62 Shoulder ER (shd/elbow - 90/90) - 95  UPPER EXTREMITY ROM:   Passive ROM Right eval Right  11/23/21  Left eval Left  11/07/21 Left  11/23/21 Left 12/11/21  Shoulder flexion 180  171 175 180 180  Shoulder extension  82 60 70 72 72  Shoulder abduction   170 180 180 180  Shoulder adduction        Shoulder internal rotation   35 in scapular plane 67 in scapular plane   67 in scapular plane at side   Shoulder external rotation   90 in scapular plane 95 in scapular plane   98 in scapular plane shd/elbow 90/90   Strength: Lt shoulder 5/5 flexion, abduction, ER, IR Middle trap, lower trap 5-/5              FOTO: 83  PALPATION:  WFL's    TODAY'S TREATMENT:  12/27/21 Therex: Pulley - flexion 10 sec x 10  Pulley scaption 10 x 10 Pulley horiz ab/add in mid range  UBE L6 x 5 min alt fwd/back ea minute Bodyblade 60 sec x 2 each flexion Bodyblade 60 sec x 2 bilat waist to overhead Row black TB x 10 x 2 sets Bow and arrow black TB x 10 each side  Push up blocks press ups sitting x 10 reps  Ball on wall stepping under ball 60 sec x 1 reps  Ball on wall bouncing overhead 1 min x 2 reps  Standing shoulder flexion end range bilat x 20-25 reps   Doorway stretch 3 positions x 30 each x 2 reps    Shoulder flexion stepping under dowel 30 sec x 3   IR with strap w/ball ant Lt shd 20 sec x 5 reps  Other exercises in  clinic and at home:  Lat pull blue TB x 10 Row blue TB x 10 Bow and arrow blue TB x 10 each side ER blue TB x 10 IR blue TB x 10 Wall push up 4" ball Lt hand x 10  Wall push up with clap x 10  Serratus red TB x 10 High row with ER yellow x 10 reps  Counter plank 60 sec x 2 reps Bent row 10# KB x 20  Overhead press bamboo pole w/ 2.5# each side  ER w/back to wall, ball on dorsum of hand ~ 1-2 min Wall angel x 10 Therex ball on wall stretch into flexion Therex ball on wall bouncing ball on wall Therex ball on wall active shoulder flexion end range Childs pose hands on bolster 30-60 sec hold x 2  Sidelying sleeper stretch with noodle 30 sec x 3  AAROM using dowel all planes x 5 reps each Prone shoulder flexion end range Lt only and bilat  Ball release in standing lateral scap border UE in ER  Manual: 12/27/21: PROM shoulder and scapula mobs all planes supine  Neuromuscular re-ed pt sidelying reaching fwd/rolling ball fwd to assist/support Lt UE   Modalities: None today  PATIENT EDUCATION: Education details: POC; HEP; ICE Person educated: Patient and Spouse Education method: Explanation, Demonstration, Tactile cues, and Verbal cues Education comprehension: verbalized understanding, returned demonstration, verbal cues required, tactile cues required, and needs further education   HOME EXERCISE PROGRAM:  Access Code: [email or web address] URL: [email or web address] Date: 11/23/2021 Prepared by: Gillermo Murdoch Exercises - Doorway Pec Stretch at 60 Degrees Abduction  - 3 x daily - 7 x weekly - 1 sets - 3 reps - Doorway Pec Stretch at 90 Degrees Abduction  - 3 x daily - 7 x weekly - 1 sets - 3 reps - 30 seconds  hold - Doorway Pec  Stretch at 120 Degrees Abduction  - 3 x daily - 7 x weekly - 1 sets - 3 reps - 30 second hold  hold - Standing Shoulder Internal Rotation Stretch with Towel  - 2 x daily - 7 x weekly - 1 sets - 3-5 reps - 15-20 sec  hold - Seated Shoulder External  Rotation AAROM with Cane and Hand in Neutral  - 2 x daily - 7 x weekly - 1 sets - 5-10 reps - 5-10 sec  hold - Standing Bilateral Low Shoulder Row with Anchored Resistance  - 2 x daily - 7 x weekly - 1-3 sets - 10 reps - 2-3 sec  hold - Drawing Bow  - 1 x daily - 7 x weekly - 1 sets - 10 reps - 3 sec  hold - Supine PNF D2 Flexion with Resistance  - 1 x daily - 7 x weekly - 3 sets - 10 reps - Shoulder External Rotation with Anchored Resistance  - 2 x daily - 7 x weekly - 1-2 sets - 10 reps - 3 sec  hold - Shoulder Internal Rotation with Resistance  - 2 x daily - 7 x weekly - 2 sets - 10 reps - 3 sec  hold - Shoulder Flexion Serratus Activation with Resistance  - 1 x daily - 7 x weekly - 1 sets - 10 reps - 3-5 sec  hold - Seated High Shoulder Row with Anchored Resistance  - 2 x daily - 7 x weekly - 1 sets - 10 reps - 3 sec  hold - Wall Push Up  - 1 x daily - 7 x weekly - 1-3 sets - 10 reps - 3 sec  hold - Plank on Counter  - 2 x daily - 7 x weekly - 1 sets - 3 reps - 30 sec  hold - Standing Lat Pull Down with Resistance - Elbows Bent  - 1 x daily - 7 x weekly - 1 sets - 10 reps - 3 sec  hold - Child's Pose Stretch  - 1 x daily - 7 x weekly - 1 sets - 3 reps - 30 sec  hold - Shoulder Overhead Press in Flexion with Dumbbells  - 1 x daily - 7 x weekly - 1 sets - 10 reps - 3 sec  hold - Standing Bent Over Single Arm Shoulder Row  - 1 x daily - 7 x weekly - 2-3 sets - 10 reps - 3 sec  hold - Isometric Shoulder External Rotation in Abduction with Ball at Wall  - 1 x daily - 7 x weekly - 1-2 min hold - Quadruped [employer] with Resistance  - 1 x daily - 7 x weekly - 1 sets - 10 reps - 3 sec  hold - Wall Angels  - 2 x daily - 7 x weekly - 1 sets - 10 reps - 2-3 sec  hold  ASSESSMENT:  CLINICAL IMPRESSION: 12/27/21:  Continued soreness in the Lt shoulder at times - some following end range stretching and work/exercises at home and in the clinic. He demonstrates continued increases in shoulder ROM and  strength. Lillie has continued tightness in shoulder extension, IR and abduction at end ranges. Progressing well with strengthening exercises.     GOALS: Goals reviewed with patient? Yes   LONG TERM GOALS: Target date: 02/21/2022  (Remove Blue Hyperlink)  Patient demonstrates full PROM Lt shoulder  Baseline:  Goal status: INITIAL  2.  Patient demonstrates AROM Lt shoulder  to ~ equal to AROM Rt shoulder - with good scapular control  Baseline:  Goal status: INITIAL  3.  5/5 strength Rt shoulder  Baseline:  Goal status: INITIAL  4.  Patient reports return normal functional activities with minimal to no Lt shoulder pain  Baseline:  Goal status: INITIAL  5.  Independent in HEP  Baseline:  Goal status: INITIAL  6.  Improve functional limitation score to 59 Baseline:  Goal status: INITIAL   PLAN: PT FREQUENCY: 3x/week  PT DURATION: 6 weeks  PLANNED INTERVENTIONS: Therapeutic exercises, Therapeutic activity, Neuromuscular re-education, Patient/Family education, Self Care, Joint mobilization, Aquatic Therapy, Dry Needling, Electrical stimulation, Cryotherapy, Moist heat, Taping, Vasopneumatic device, Ultrasound, Manual therapy, and Re-evaluation  PLAN FOR NEXT SESSION: Review and progress HEP, progressing with patient doing AAROM to PROM progressing to active exercise and resistive exercises as tolerated. Focus on ROM  and strength post MUA with I & D Lt shoulder 10/31/21. Progressing with strengthening and stabilization. Manual work and modalities as indicated.     Everardo All, PT, MPH  12/27/2021, 7:29 AM

## 2022-01-03 ENCOUNTER — Encounter: Payer: 59 | Admitting: Rehabilitative and Restorative Service Providers"

## 2022-01-10 ENCOUNTER — Ambulatory Visit: Payer: 59 | Admitting: Rehabilitative and Restorative Service Providers"

## 2022-01-17 ENCOUNTER — Encounter: Payer: 59 | Admitting: Physical Therapy

## 2022-01-24 ENCOUNTER — Other Ambulatory Visit: Payer: Self-pay | Admitting: Family Medicine

## 2022-01-24 DIAGNOSIS — E785 Hyperlipidemia, unspecified: Secondary | ICD-10-CM

## 2022-02-07 ENCOUNTER — Encounter: Payer: Self-pay | Admitting: Family Medicine

## 2022-02-07 DIAGNOSIS — E119 Type 2 diabetes mellitus without complications: Secondary | ICD-10-CM

## 2022-02-07 DIAGNOSIS — E785 Hyperlipidemia, unspecified: Secondary | ICD-10-CM

## 2022-02-07 DIAGNOSIS — Z125 Encounter for screening for malignant neoplasm of prostate: Secondary | ICD-10-CM

## 2022-02-07 DIAGNOSIS — E559 Vitamin D deficiency, unspecified: Secondary | ICD-10-CM

## 2022-02-07 NOTE — Telephone Encounter (Signed)
Labs pended for recheck of CMP/LIPIDS/A1c. Routing to provider.

## 2022-02-07 NOTE — Telephone Encounter (Signed)
Lab orders signed.

## 2022-02-16 ENCOUNTER — Other Ambulatory Visit: Payer: Self-pay | Admitting: Family Medicine

## 2022-02-16 DIAGNOSIS — E785 Hyperlipidemia, unspecified: Secondary | ICD-10-CM

## 2022-02-19 ENCOUNTER — Ambulatory Visit (INDEPENDENT_AMBULATORY_CARE_PROVIDER_SITE_OTHER): Payer: 59 | Admitting: Family Medicine

## 2022-02-19 ENCOUNTER — Encounter: Payer: Self-pay | Admitting: Family Medicine

## 2022-02-19 VITALS — BP 138/65 | HR 64 | Ht 64.0 in | Wt 157.6 lb

## 2022-02-19 DIAGNOSIS — E785 Hyperlipidemia, unspecified: Secondary | ICD-10-CM

## 2022-02-19 DIAGNOSIS — E119 Type 2 diabetes mellitus without complications: Secondary | ICD-10-CM | POA: Diagnosis not present

## 2022-02-19 DIAGNOSIS — S43432D Superior glenoid labrum lesion of left shoulder, subsequent encounter: Secondary | ICD-10-CM

## 2022-02-19 DIAGNOSIS — Z23 Encounter for immunization: Secondary | ICD-10-CM | POA: Diagnosis not present

## 2022-02-19 NOTE — Progress Notes (Signed)
Darius Roberts - 56 y.o. male MRN [id number]  Date of birth: 1965/04/04  Subjective Chief Complaint  Patient presents with   Follow-up    HPI Darius Roberts is a 56 y.o. male here today for follow up.   Reports that he is doing well.  Had shoulder surgery a few months ago.  ROM is better, still with some pain.    Remains on berberine complex to manage his diabetes. He is tolerating this well and denies side effects.  He does not monitor blood sugars at home.  He does stay pretty active.      Continues on atorvastatin for management of associated HLD.  He is also taking a fish oil supplement.  Denies side effects from medications.    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)    Diabetes ([facility]) 09/09/2017   Dyslipidemia 07/22/2014   GERD (gastroesophageal reflux disease)     Past Surgical History:  Procedure Laterality Date   KIDNEY DONATION Right     Social History   Socioeconomic History   Marital status: Married    Spouse name: Not on file   Number of children: 3   Years of education: Not on file   Highest education level: Not on file  Occupational History   Not on file  Tobacco Use   Smoking status: Never   Smokeless tobacco: Never  Vaping Use   Vaping Use: Never used  Substance and Sexual Activity   Alcohol use: Yes    Alcohol/week: 0.0 standard drinks of alcohol   Drug use: Yes   Sexual activity: Yes    Partners: Female  Other Topics Concern   Not on file  Social History Narrative   Not on file   Social Determinants of Health   Financial Resource Strain: Not on file  Food Insecurity: Not on file  Transportation Needs: Not on file  Physical Activity: Not on file  Stress: Not on file  Social Connections: Not on file     Family History  Problem Relation Age of Onset   Diabetes Mother    Heart disease Father    Hypertension Maternal Uncle    Hyperlipidemia Paternal Uncle     Health Maintenance  Topic Date Due   INFLUENZA VACCINE  10/24/2021   Diabetic kidney evaluation - Urine ACR  01/11/2022   HEMOGLOBIN A1C  02/11/2022   OPHTHALMOLOGY EXAM  04/26/2022 (Originally 12/18/1976)   COVID-19 Vaccine (4 - 2023-24 season) 04/26/2022 (Originally 11/24/2021)   Hepatitis C Screening  08/17/2022 (Originally 12/18/1984)   Diabetic kidney evaluation - GFR measurement  08/12/2022   FOOT EXAM  02/20/2023   COLONOSCOPY (Pts 45-43yrs Insurance coverage will need to be confirmed)  05/31/2027   HIV Screening  Completed   Zoster Vaccines- Shingrix  Completed   HPV VACCINES  Aged Out     ----------------------------------------------------------------------------------------------------------------------------------------------------------------------------------------------------------------- Physical Exam BP 138/65 (BP Location: Right Arm, Patient Position: Sitting, Cuff Size: Normal)   Pulse 64   Ht 5\' 4"  (1.626 m)   Wt 157 lb 9.6 oz (71.5 kg)   SpO2 97%   BMI 27.05 kg/m   Physical Exam Constitutional:      Appearance: Normal appearance.  HENT:     Head: Normocephalic and atraumatic.  Eyes:     General: No scleral icterus. Cardiovascular:     Rate and Rhythm: Normal rate and regular rhythm.  Pulmonary:     Effort: Pulmonary effort is normal.     Breath sounds: Normal breath sounds.  Musculoskeletal:     Cervical back: Neck supple.  Neurological:     Mental Status: He is alert.  Psychiatric:        Mood and Affect: Mood normal.        Behavior: Behavior normal.     ------------------------------------------------------------------------------------------------------------------------------------------------------------------------------------------------------------------- Assessment and  Plan  Type 2 diabetes mellitus without complications ([facility]) Updated A1c drawn today.  He feels good with berberine and regular exercise.  Encouraged continuation of diet and exercise change.   Dyslipidemia Updated lipid panel drawn today.  Continue atorvastatin at current strength.   Labral tear of shoulder S/p surgery.  Recommend continued home exercises.    No orders of the defined types were placed in this encounter.   Return in about 6 months (around 08/20/2022) for T2DM.    This visit occurred during the SARS-CoV-2 public health emergency.  Safety protocols were in place, including screening questions prior to the visit, additional usage of staff PPE, and extensive cleaning of exam room while observing appropriate contact time as indicated for disinfecting solutions.

## 2022-02-19 NOTE — Assessment & Plan Note (Signed)
Updated lipid panel drawn today.  Continue atorvastatin at current strength.

## 2022-02-19 NOTE — Assessment & Plan Note (Signed)
S/p surgery.  Recommend continued home exercises.

## 2022-02-19 NOTE — Assessment & Plan Note (Signed)
Updated A1c drawn today.  He feels good with berberine and regular exercise.  Encouraged continuation of diet and exercise change.

## 2022-02-20 LAB — COMPLETE METABOLIC PANEL WITH GFR
AG Ratio: 1.8 (calc) (ref 1.0–2.5)
ALT: 29 U/L (ref 9–46)
AST: 20 U/L (ref 10–35)
Albumin: 4.6 g/dL (ref 3.6–5.1)
Alkaline phosphatase (APISO): 66 U/L (ref 35–144)
BUN: 17 mg/dL (ref 7–25)
CO2: 30 mmol/L (ref 20–32)
Calcium: 9.5 mg/dL (ref 8.6–10.3)
Chloride: 101 mmol/L (ref 98–110)
Creat: 0.94 mg/dL (ref 0.70–1.30)
Globulin: 2.5 g/dL (calc) (ref 1.9–3.7)
Glucose, Bld: 161 mg/dL — ABNORMAL HIGH (ref 65–99)
Potassium: 4.2 mmol/L (ref 3.5–5.3)
Sodium: 139 mmol/L (ref 135–146)
Total Bilirubin: 1.1 mg/dL (ref 0.2–1.2)
Total Protein: 7.1 g/dL (ref 6.1–8.1)
eGFR: 96 mL/min/{1.73_m2} (ref >=60)

## 2022-02-20 LAB — LIPID PANEL W/REFLEX DIRECT LDL
Cholesterol: 147 mg/dL (ref <200)
HDL: 50 mg/dL (ref >=40)
LDL Cholesterol (Calc): 69 mg/dL (calc)
Non-HDL Cholesterol (Calc): 97 mg/dL (calc) (ref <130)
Total CHOL/HDL Ratio: 2.9 (calc) (ref <5.0)
Triglycerides: 214 mg/dL — ABNORMAL HIGH (ref <150)

## 2022-02-20 LAB — HEMOGLOBIN A1C
Hgb A1c MFr Bld: 7.8 % of total Hgb — ABNORMAL HIGH (ref <5.7)
Mean Plasma Glucose: 177 mg/dL
eAG (mmol/L): 9.8 mmol/L

## 2022-02-20 LAB — CBC
HCT: 45.6 % (ref 38.5–50.0)
Hemoglobin: 15.6 g/dL (ref 13.2–17.1)
MCH: 31 pg (ref 27.0–33.0)
MCHC: 34.2 g/dL (ref 32.0–36.0)
MCV: 90.5 fL (ref 80.0–100.0)
MPV: 10.2 fL (ref 7.5–12.5)
Platelets: 183 10*3/uL (ref 140–400)
RBC: 5.04 10*6/uL (ref 4.20–5.80)
RDW: 12.1 % (ref 11.0–15.0)
WBC: 4.1 10*3/uL (ref 3.8–10.8)

## 2022-02-20 LAB — PSA, TOTAL AND FREE
PSA, % Free: 33 % (calc) (ref >25)
PSA, Free: 0.2 ng/mL
PSA, Total: 0.6 ng/mL (ref <=4.0)

## 2022-02-20 LAB — MICROALBUMIN / CREATININE URINE RATIO
Creatinine, Urine: 47 mg/dL (ref 20–320)
Microalb, Ur: <0.2 mg/dL

## 2022-02-23 ENCOUNTER — Encounter: Payer: Self-pay | Admitting: Family Medicine

## 2022-06-12 ENCOUNTER — Telehealth: Payer: 59 | Admitting: Physician Assistant

## 2022-06-12 DIAGNOSIS — B9689 Other specified bacterial agents as the cause of diseases classified elsewhere: Secondary | ICD-10-CM | POA: Diagnosis not present

## 2022-06-12 DIAGNOSIS — J208 Acute bronchitis due to other specified organisms: Secondary | ICD-10-CM

## 2022-06-12 MED ORDER — ALBUTEROL SULFATE HFA 108 (90 BASE) MCG/ACT IN AERS: 2.0000 | INHALATION_SPRAY | Freq: Four times a day (QID) | RESPIRATORY_TRACT | 0 refills | Status: DC | PRN

## 2022-06-12 MED ORDER — BENZONATATE 100 MG PO CAPS: 100.0000 mg | ORAL_CAPSULE | Freq: Three times a day (TID) | ORAL | 0 refills | Status: DC | PRN

## 2022-06-12 MED ORDER — AZITHROMYCIN 250 MG PO TABS: ORAL_TABLET | ORAL | 0 refills | Status: AC

## 2022-06-12 NOTE — Progress Notes (Signed)
Virtual Visit Consent   Darius Roberts, you are scheduled for a virtual visit with a [facility] provider today. Just as with appointments in the office, your consent must be obtained to participate. Your consent will be active for this visit and any virtual visit you may have with one of our providers in the next 365 days. If you have a MyChart account, a copy of this consent can be sent to you electronically.  As this is a virtual visit, video technology does not allow for your provider to perform a traditional examination. This may limit your provider's ability to fully assess your condition. If your provider identifies any concerns that need to be evaluated in person or the need to arrange testing (such as labs, EKG, etc.), we will make arrangements to do so. Although advances in technology are sophisticated, we cannot ensure that it will always work on either your end or our end. If the connection with a video visit is poor, the visit may have to be switched to a telephone visit. With either a video or telephone visit, we are not always able to ensure that we have a secure connection.  By engaging in this virtual visit, you consent to the provision of healthcare and authorize for your insurance to be billed (if applicable) for the services provided during this visit. Depending on your insurance coverage, you may receive a charge related to this service.  I need to obtain your verbal consent now. Are you willing to proceed with your visit today? Tillmon Brazeau has provided verbal consent on 06/12/2022 for a virtual visit (video or telephone). Leeanne Rio, [place]  Date: 06/12/2022 8:42 AM  Virtual Visit via Video Note   I, Leeanne Rio, connected with  Darius Roberts  ([id number], 05/04/1965) on 06/12/22 at  8:30 AM EDT by a video-enabled telemedicine application and verified that I am speaking with the correct person using two identifiers.  Location: Patient: Virtual Visit Location Patient:  Home Provider: Virtual Visit Location Provider: Home Office   I discussed the limitations of evaluation and management by telemedicine and the availability of in person appointments. The patient expressed understanding and agreed to proceed.    History of Present Illness: Darius Roberts is a 57 y.o. who identifies as a male who was assigned male at birth, and is being seen today for URI symptoms starting last week with just a mild, dry cough. Notes son was sick last week with URI. Patient notes that his symptoms are not getting better like his son's and continue to progress now with low-grade fever, substantial increase in chest congestion with cough now productive of thick colored sputum. Last night around 2am he had a moment where he was having difficulty with breathing. None noted so far today.   HPI: HPI  Problems:  Patient Active Problem List   Diagnosis Date Noted   Well adult exam 08/16/2021   Labral tear of shoulder 07/17/2021   Type 2 diabetes mellitus without complications ([place]) [id number]   Achilles tendonitis, bilateral 09/13/2016   Absence of kidney 07/22/2014   Dyslipidemia 07/22/2014   Acid reflux 07/22/2014   Avitaminosis D 07/22/2014    Allergies:  Allergies  Allergen Reactions   Amoxicillin Rash   Promethazine Other (See Comments)    Extreme nausea/stomach upset   Medications:  Current Outpatient Medications:    albuterol (VENTOLIN HFA) 108 (90 Base) MCG/ACT inhaler, Inhale 2 puffs into the lungs every 6 (six) hours as needed for wheezing or shortness  of breath., Disp: 8 g, Rfl: 0   azithromycin (ZITHROMAX) 250 MG tablet, Take 2 tablets on day 1, then 1 tablet daily on days 2 through 5, Disp: 6 tablet, Rfl: 0   benzonatate (TESSALON) 100 MG capsule, Take 1 capsule (100 mg total) by mouth 3 (three) times daily as needed for cough., Disp: 30 capsule, Rfl: 0   AMBULATORY NON FORMULARY MEDICATION, Single glucometer with lancets, test strips quantity sufficient for daily  testing.  Dispense 83-month supply of test strips and lancets with 3 refills.  Fax to [facility] [place]. E11.9, Disp: 1 each, Rfl: 3   atorvastatin (LIPITOR) 40 MG tablet, TAKE 1 TABLET (40 MG TOTAL) BY MOUTH DAILY. PT NEEDS TO KEEP APPOINTMENT ON 02/19/2022 WITH PCP, Disp: 90 tablet, Rfl: 1   Barberry-Oreg Grape-Goldenseal (BERBERINE COMPLEX PO), Take by mouth 2 (two) times daily., Disp: , Rfl:    cholecalciferol (VITAMIN D) 1000 units tablet, Take 1,000 Units by mouth daily., Disp: , Rfl:    Famotidine (PEPCID PO), Take by mouth., Disp: , Rfl:    Multiple Vitamin (MULTIVITAMIN) capsule, Take 1 capsule by mouth daily., Disp: , Rfl:    Omega-3 Fatty Acids (FISH OIL) 1000 MG CAPS, Take 1,000 mg by mouth 2 (two) times daily., Disp: , Rfl:   Observations/Objective: Patient is well-developed, well-nourished in no acute distress.  Resting comfortably at home.  Head is normocephalic, atraumatic.  No labored breathing. Speech is clear and coherent with logical content.  Patient is alert and oriented at baseline.   Assessment and Plan: 1. Acute bacterial bronchitis - azithromycin (ZITHROMAX) 250 MG tablet; Take 2 tablets on day 1, then 1 tablet daily on days 2 through 5  Dispense: 6 tablet; Refill: 0 - benzonatate (TESSALON) 100 MG capsule; Take 1 capsule (100 mg total) by mouth 3 (three) times daily as needed for cough.  Dispense: 30 capsule; Refill: 0 - albuterol (VENTOLIN HFA) 108 (90 Base) MCG/ACT inhaler; Inhale 2 puffs into the lungs every 6 (six) hours as needed for wheezing or shortness of breath.  Dispense: 8 g; Refill: 0  Rx Azithromycin.  Increase fluids.  Rest.  Saline nasal spray.  Probiotic.  Mucinex as directed.  Humidifier in bedroom. Tessalon and Albuterol per orders.  Call or return to clinic if symptoms are not improving.   Follow Up Instructions: I discussed the assessment and treatment plan with the patient. The patient was provided an opportunity to ask  questions and all were answered. The patient agreed with the plan and demonstrated an understanding of the instructions.  A copy of instructions were sent to the patient via MyChart unless otherwise noted below.   The patient was advised to call back or seek an in-person evaluation if the symptoms worsen or if the condition fails to improve as anticipated.  Time:  I spent 10 minutes with the patient via telehealth technology discussing the above problems/concerns.    Leeanne Rio, PA-C

## 2022-06-12 NOTE — Patient Instructions (Signed)
Darius Roberts, thank you for joining Darius Rio, PA-C for today's virtual visit.  While this provider is not your primary Roberts provider (PCP), if your PCP is located in our provider database this encounter information will be shared with them immediately following your visit.   Darius Roberts account gives you access to today's visit and all your visits, tests, and labs performed at [facility] " click here if you don't have a Darius Roberts account or go to [email or web address]  Consent: (Patient) Darius Roberts provided verbal consent for this virtual visit at the beginning of the encounter.  Current Medications:  Current Outpatient Medications:    AMBULATORY NON FORMULARY MEDICATION, Single glucometer with lancets, test strips quantity sufficient for daily testing.  Dispense 75-month supply of test strips and lancets with 3 refills.  [facility] E11.9, Disp: 1 each, Rfl: 3   atorvastatin (LIPITOR) 40 MG tablet, TAKE 1 TABLET (40 MG TOTAL) BY MOUTH DAILY. PT NEEDS TO KEEP APPOINTMENT ON 02/19/2022 WITH PCP, Disp: 90 tablet, Rfl: 1   Barberry-Oreg Grape-Goldenseal (BERBERINE COMPLEX PO), Take by mouth 2 (two) times daily., Disp: , Rfl:    cholecalciferol (VITAMIN D) 1000 units tablet, Take 1,000 Units by mouth daily., Disp: , Rfl:    Famotidine (PEPCID PO), Take by mouth., Disp: , Rfl:    Multiple Vitamin (MULTIVITAMIN) capsule, Take 1 capsule by mouth daily., Disp: , Rfl:    Omega-3 Fatty Acids (FISH OIL) 1000 MG CAPS, Take 1,000 mg by mouth 2 (two) times daily., Disp: , Rfl:    Medications ordered in this encounter:  No orders of the defined types were placed in this encounter.    *If you need refills on other medications prior to your next appointment, please contact your pharmacy*  Follow-Up: Call back or seek an in-person evaluation if the symptoms worsen or if the condition fails to improve as anticipated.  Darius Roberts [phone]  Other Instructions Take antibiotic (Azithromycin) as directed.  Increase fluids.  Get plenty of rest. Use Mucinex for congestion. USe Tessalon and albuterol if . Take a daily probiotic (I recommend Align or Culturelle, but even Activia Yogurt may be beneficial).  A humidifier placed in the bedroom may offer some relief for a dry, scratchy throat of nasal irritation.  Read information below on acute bronchitis. Please call or return to clinic if symptoms are not improving.  Acute Bronchitis Bronchitis is when the airways that extend from the windpipe into the lungs get red, puffy, and painful (inflamed). Bronchitis often causes thick spit (mucus) to develop. This leads to a cough. A cough is the most common symptom of bronchitis. In acute bronchitis, the condition usually begins suddenly and goes away over time (usually in 2 weeks). Smoking, allergies, and asthma can make bronchitis worse. Repeated episodes of bronchitis may cause more lung problems.  HOME Roberts Rest. Drink enough fluids to keep your pee (urine) clear or pale yellow (unless you need to limit fluids as told by your doctor). Only take over-the-counter or prescription medicines as told by your doctor. Avoid smoking and secondhand smoke. These can make bronchitis worse. If you are a smoker, think about using nicotine gum or skin patches. Quitting smoking will help your lungs heal faster. Reduce the chance of getting bronchitis again by: Washing your hands often. Avoiding people with cold symptoms. Trying not to touch your hands to your mouth, nose, or eyes. Follow up with your  doctor as told.  GET HELP IF: Your symptoms do not improve after 1 week of treatment. Symptoms include: Cough. Fever. Coughing up thick spit. Body aches. Chest congestion. Chills. Shortness of breath. Sore throat.  GET HELP RIGHT AWAY IF:  You have an increased fever. You have chills. You have severe shortness of  breath. You have bloody thick spit (sputum). You throw up (vomit) often. You lose too much body fluid (dehydration). You have a severe headache. You faint.  MAKE SURE YOU:  Understand these instructions. Will watch your condition. Will get help right away if you are not doing well or get worse. Document Released: 08/29/2007 Document Revised: 11/12/2012 Document Reviewed: 09/02/2012 Darius Roberts Patient Information 2015 Darius Roberts, Darius Roberts. This information is not intended to replace advice given to you by your health Roberts provider. Make sure you discuss any questions you have with your health Roberts provider.    If you have been instructed to have an in-person evaluation today at a local Urgent Roberts facility, please use the link below. It will take you to a list of all of our available Darius Roberts, including address, phone number and hours of operation. Please do not delay Roberts.  [facility] Roberts [facility]  If you or a family member do not have a primary Roberts provider, use the link below to schedule a visit and establish Roberts. When you choose a Darius Roberts primary Roberts physician or advanced practice provider, you gain a long-term partner in health. Find a Primary Roberts Provider  Learn more about [facility]'s in-office and virtual Roberts options: Darius Roberts

## 2022-06-13 MED ORDER — LEVALBUTEROL TARTRATE 45 MCG/ACT IN AERO: 1.0000 | INHALATION_SPRAY | Freq: Four times a day (QID) | RESPIRATORY_TRACT | 0 refills | Status: AC | PRN

## 2022-06-13 MED ORDER — ALBUTEROL SULFATE HFA 108 (90 BASE) MCG/ACT IN AERS: 1.0000 | INHALATION_SPRAY | Freq: Four times a day (QID) | RESPIRATORY_TRACT | 0 refills | Status: DC | PRN

## 2022-06-13 NOTE — Addendum Note (Signed)
Addended by: Mar Daring on: 06/13/2022 04:56 PM   Modules accepted: Orders

## 2022-06-13 NOTE — Addendum Note (Signed)
Addended by: Mar Daring on: 06/13/2022 04:51 PM   Modules accepted: Orders

## 2022-07-09 ENCOUNTER — Encounter: Payer: Self-pay | Admitting: *Deleted

## 2022-07-12 LAB — HM DIABETES EYE EXAM

## 2022-08-18 ENCOUNTER — Other Ambulatory Visit: Payer: Self-pay | Admitting: Family Medicine

## 2022-08-18 DIAGNOSIS — E785 Hyperlipidemia, unspecified: Secondary | ICD-10-CM

## 2022-08-21 ENCOUNTER — Ambulatory Visit (INDEPENDENT_AMBULATORY_CARE_PROVIDER_SITE_OTHER): Payer: 59 | Admitting: Family Medicine

## 2022-08-21 ENCOUNTER — Encounter: Payer: Self-pay | Admitting: Family Medicine

## 2022-08-21 VITALS — BP 114/75 | HR 77 | Ht 64.0 in | Wt 154.0 lb

## 2022-08-21 DIAGNOSIS — E119 Type 2 diabetes mellitus without complications: Secondary | ICD-10-CM

## 2022-08-21 DIAGNOSIS — E785 Hyperlipidemia, unspecified: Secondary | ICD-10-CM | POA: Diagnosis not present

## 2022-08-21 LAB — POCT GLYCOSYLATED HEMOGLOBIN (HGB A1C): HbA1c, POC (controlled diabetic range): 7.6 % — AB (ref 0.0–7.0)

## 2022-08-21 MED ORDER — RYBELSUS 7 MG PO TABS: 7.0000 mg | ORAL_TABLET | Freq: Every day | ORAL | 0 refills | Status: DC

## 2022-08-21 MED ORDER — RYBELSUS 3 MG PO TABS: 3.0000 mg | ORAL_TABLET | Freq: Every day | ORAL | 0 refills | Status: DC

## 2022-08-21 NOTE — Progress Notes (Signed)
Darius Roberts - 57 y.o. male MRN [id number]  Date of birth: Nov 21, 1965  Subjective Chief Complaint  Patient presents with   Diabetes    HPI Saunders Heinzman is a 57 y.o. here today for follow up visit.   He reports that he is doing pretty well today.  He had a recent ankle injury moving furniture downstairs but this seems to be resolved for the most part at this time.   He has been taking berberine to help with managing his diabetes.  Last A1c elevated at 7.8%, down to 7.6% today.  He admits he has not really made any changes to diet or exercise patterns.   Tolerating atorvastatin well for associated HLD.    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)    Diabetes (HCC) 09/09/2017   Dyslipidemia 07/22/2014   GERD (gastroesophageal reflux disease)     Past Surgical History:  Procedure Laterality Date   KIDNEY DONATION Right     Social History   Socioeconomic History   Marital status: Married    Spouse name: Not on file   Number of children: 3   Years of education: Not on file   Highest education level: Some college, no degree  Occupational History   Not on file  Tobacco Use   Smoking status: Never   Smokeless tobacco: Never  Vaping Use   Vaping Use: Never used  Substance and Sexual Activity   Alcohol use: Yes    Alcohol/week: 0.0 standard drinks of alcohol   Drug use: Yes   Sexual activity: Yes    Partners: Female  Other Topics Concern   Not on file  Social History Narrative   Not on file   Social Determinants of Health   Financial Resource Strain: Low  Risk  (08/17/2022)   Overall Financial Resource Strain (CARDIA)    Difficulty of Paying Living Expenses: Not hard at all  Food Insecurity: No Food Insecurity (08/17/2022)   Hunger Vital Sign    Worried About Running Out of Food in the Last Year: Never true    Ran Out of Food in the Last Year: Never true  Transportation Needs: No Transportation Needs (08/17/2022)   PRAPARE - Administrator, Civil Service (Medical): No    Lack of Transportation (Non-Medical): No  Physical Activity: Insufficiently Active (08/17/2022)   Exercise Vital Sign    Days of Exercise per Week: 2 days    Minutes of Exercise per Session: 20 min  Stress: No Stress Concern Present (08/17/2022)   [employer] of Occupational Health - Occupational Stress Questionnaire    Feeling of Stress : Not at all  Social Connections: Moderately Isolated (08/17/2022)   Social Connection and Isolation Panel [NHANES]    Frequency of Communication with Friends and Family: Three times a week    Frequency of Social Gatherings with Friends and Family: Once a week    Attends Religious Services: Never    Database administrator or Organizations: No    Attends Banker Meetings: Not on  file    Marital Status: Married    Family History  Problem Relation Age of Onset   Diabetes Mother    Heart disease Father    Hypertension Maternal Uncle    Hyperlipidemia Paternal Uncle     Health Maintenance  Topic Date Due   COVID-19 Vaccine (4 - 2023-24 season) 12/07/2022 (Originally 11/24/2021)   OPHTHALMOLOGY EXAM  12/22/2022 (Originally 12/18/1976)   Hepatitis C Screening  08/21/2023 (Originally 12/18/1984)   INFLUENZA VACCINE  10/25/2022   Diabetic kidney evaluation - eGFR measurement  02/20/2023   Diabetic kidney evaluation - Urine ACR  02/20/2023   FOOT EXAM  02/20/2023   HEMOGLOBIN A1C  02/21/2023   DTaP/Tdap/Td (2 - Td or Tdap) 05/26/2026   Colonoscopy  05/31/2027   HIV Screening  Completed   Zoster Vaccines-  Shingrix  Completed   HPV VACCINES  Aged Out     ----------------------------------------------------------------------------------------------------------------------------------------------------------------------------------------------------------------- Physical Exam BP 114/75 (BP Location: Left Arm, Patient Position: Sitting, Cuff Size: Normal)   Pulse 77   Ht 5\' 4"  (1.626 m)   Wt 154 lb (69.9 kg)   SpO2 99%   BMI 26.43 kg/m   Physical Exam Constitutional:      Appearance: Normal appearance.  Eyes:     General: No scleral icterus. Cardiovascular:     Rate and Rhythm: Normal rate and regular rhythm.  Pulmonary:     Effort: Pulmonary effort is normal.     Breath sounds: Normal breath sounds.  Neurological:     Mental Status: He is alert.  Psychiatric:        Mood and Affect: Mood normal.        Behavior: Behavior normal.     ------------------------------------------------------------------------------------------------------------------------------------------------------------------------------------------------------------------- Assessment and Plan  Type 2 diabetes mellitus without complications (HCC) He is interested in trying Rybelsus.  Given 30 day sample of 3mg  strength and 7mg  sent to pharmacy.  Encouraged changes to diet and exercise.   Dyslipidemia Doing well with atorvastatin.  Also taking fish oil supplement.  He will continue with current medications.    Meds ordered this encounter  Medications   Semaglutide (RYBELSUS) 3 MG TABS    Sig: Take 1 tablet (3 mg total) by mouth daily. Lot: [id number] Exp: 06/2023    Dispense:  30 tablet    Refill:  0    <SAMPLE>    No follow-ups on file.    This visit occurred during the SARS-CoV-2 public health emergency.  Safety protocols were in place, including screening questions prior to the visit, additional usage of staff PPE, and extensive cleaning of exam room while observing appropriate contact time as  indicated for disinfecting solutions.

## 2022-08-21 NOTE — Assessment & Plan Note (Signed)
Doing well with atorvastatin.  Also taking fish oil supplement.  He will continue with current medications.

## 2022-08-21 NOTE — Assessment & Plan Note (Signed)
He is interested in trying Rybelsus.  Given 30 day sample of 3mg  strength and 7mg  sent to pharmacy.  Encouraged changes to diet and exercise.

## 2022-08-21 NOTE — Patient Instructions (Signed)
Start Rybelsus 3mg  daily x30 days then increase to 7mg  daily.  See me again in 6 months.

## 2022-11-01 ENCOUNTER — Encounter (INDEPENDENT_AMBULATORY_CARE_PROVIDER_SITE_OTHER): Payer: 59 | Admitting: Family Medicine

## 2022-11-01 DIAGNOSIS — R21 Rash and other nonspecific skin eruption: Secondary | ICD-10-CM | POA: Diagnosis not present

## 2022-11-01 NOTE — Telephone Encounter (Signed)

## 2022-11-08 IMAGING — MR MR SHOULDER*L* W/O CM
5 series · 40 of 40 positions shown · non-contrast
Comparison: None Available.

CLINICAL DATA: Left shoulder pain.

EXAM:
MRI OF THE LEFT SHOULDER WITHOUT CONTRAST
TECHNIQUE: Multiplanar, multisequence MR imaging of the shoulder was performed.
No intravenous contrast was administered.

[Series 3: T2 fat-sat · axial · left · 3.0mm · 0.47mm/px · z∈[-60,+41]mm · 10 of 27 slices shown (1 of 3)]
[im 1/27]
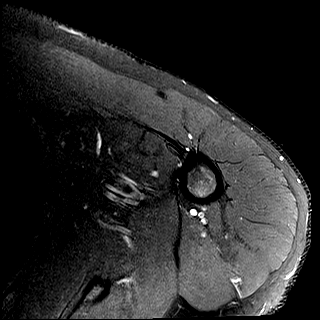
[im 3/27]
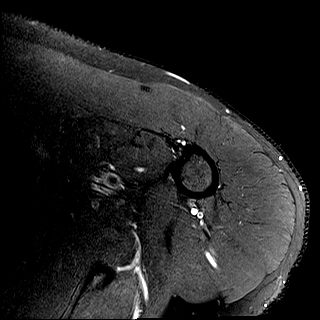
[im 6/27]
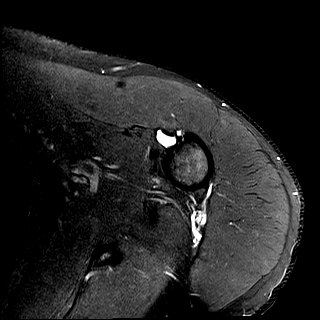
[im 9/27]
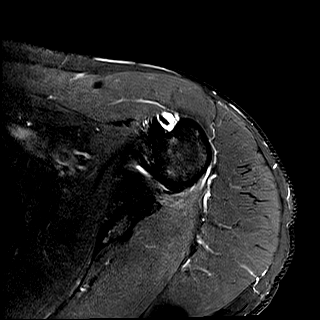
[im 12/27]
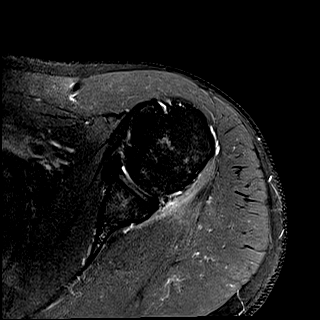
[im 15/27]
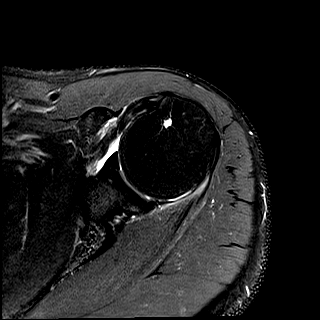
[im 18/27]
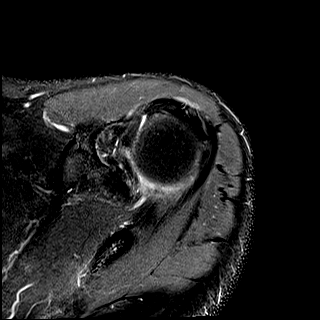
[im 21/27]
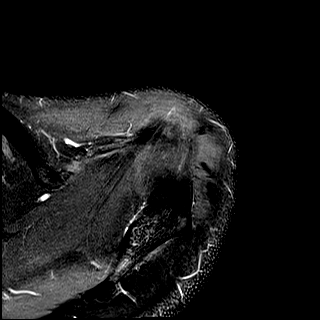
[im 24/27]
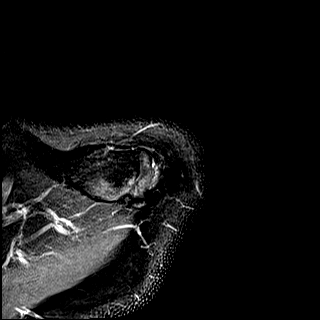
[im 27/27]
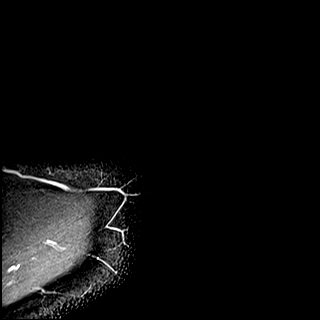

[Series 4: T2 fat-sat · oblique · left · 3.0mm · 0.44mm/px · 8 of 25 slices shown (2 of 3)]
[im 1/25]
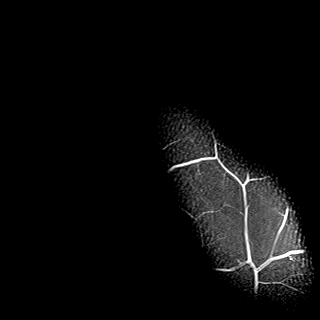
[im 4/25]
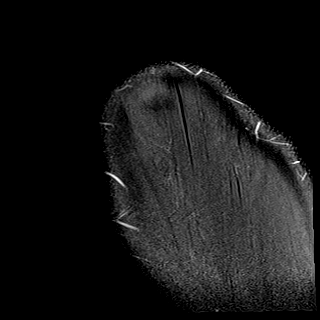
[im 7/25]
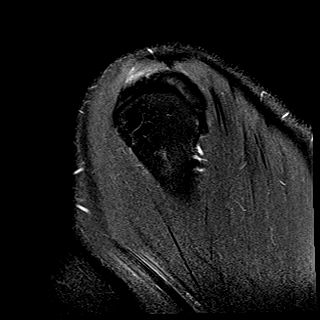
[im 11/25]
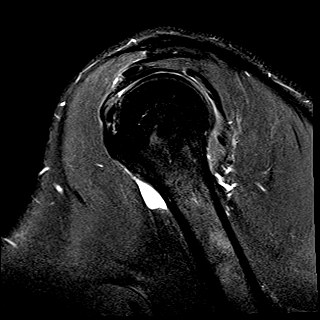
[im 14/25]
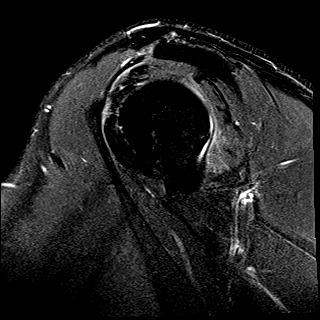
[im 18/25]
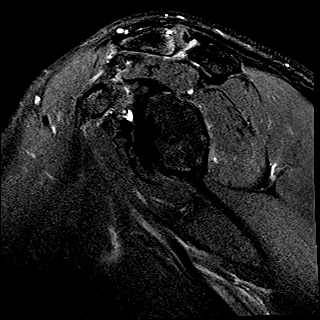
[im 21/25]
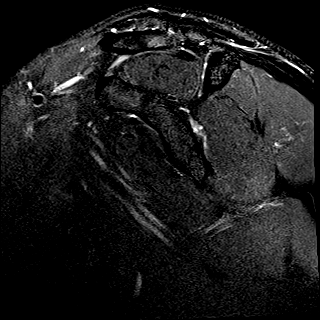
[im 25/25]
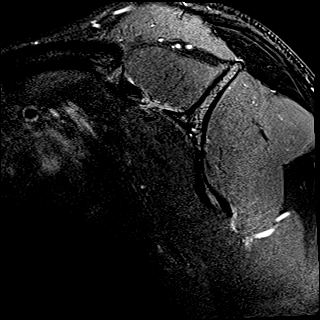

[Series 5: T1 · oblique · left · 3.0mm · 0.55mm/px · 8 of 25 slices shown]
[im 1/25]
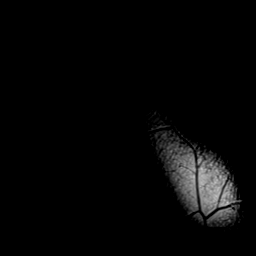
[im 4/25]
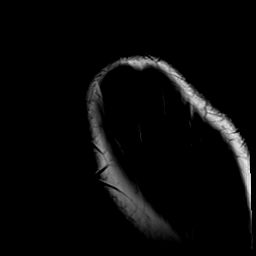
[im 7/25]
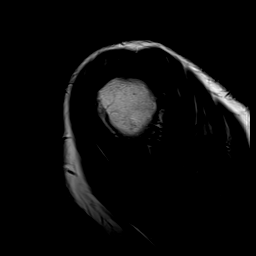
[im 11/25]
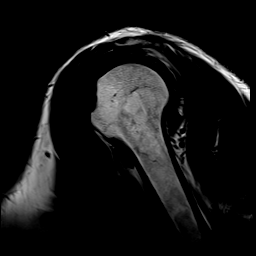
[im 14/25]
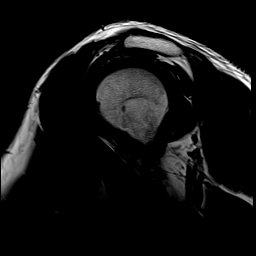
[im 18/25]
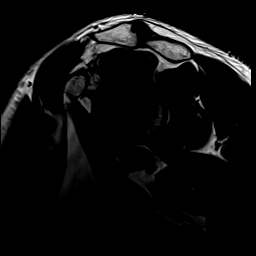
[im 21/25]
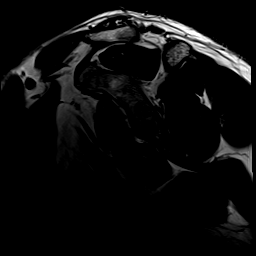
[im 25/25]
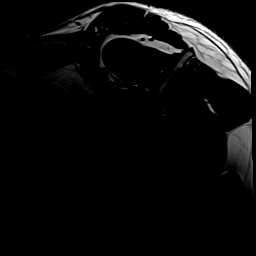

[Series 6: PD · oblique · left · 3.0mm · 0.44mm/px · 7 of 23 slices shown]
[im 1/23]
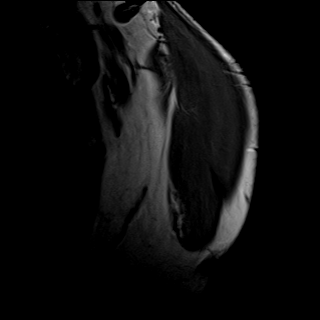
[im 4/23]
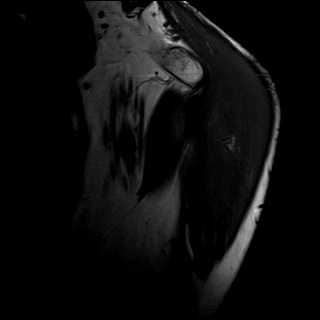
[im 8/23]
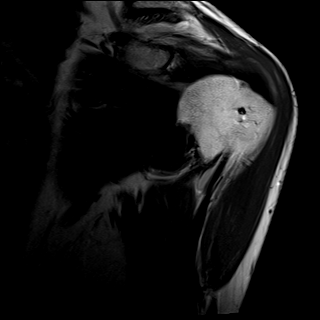
[im 12/23]
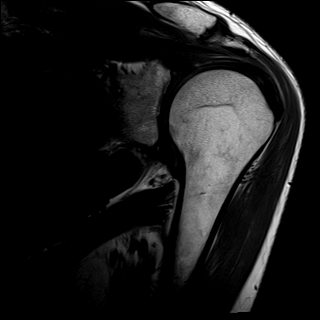
[im 15/23]
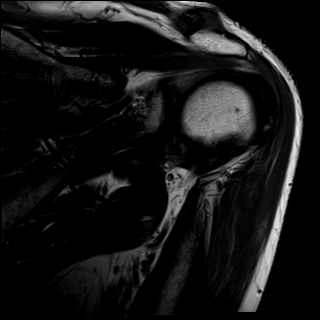
[im 19/23]
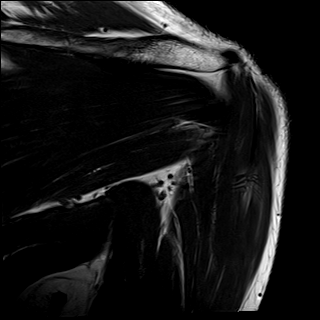
[im 23/23]
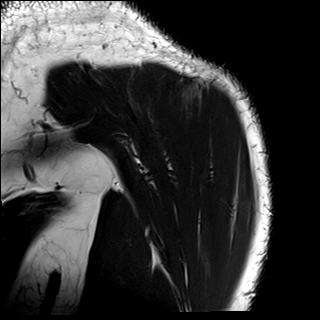

[Series 7: T2 fat-sat · oblique · left · 3.0mm · 0.44mm/px · 7 of 23 slices shown (3 of 3)]
[im 1/23]
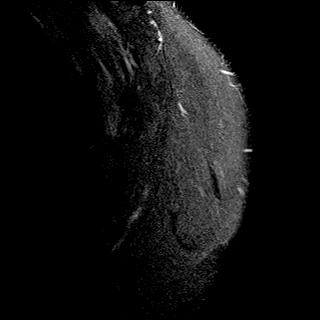
[im 4/23]
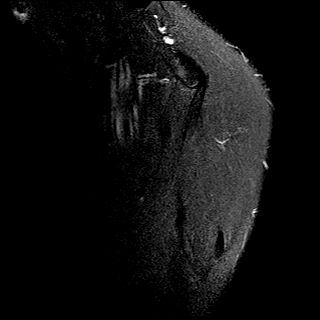
[im 8/23]
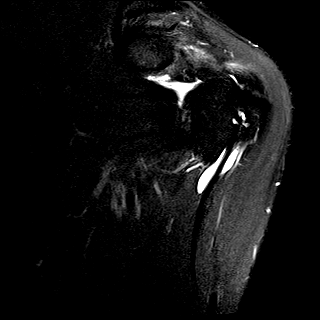
[im 12/23]
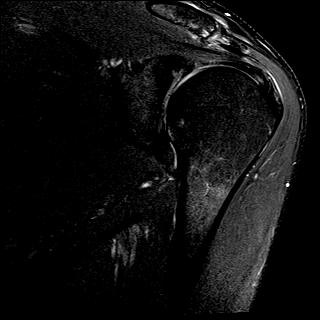
[im 15/23]
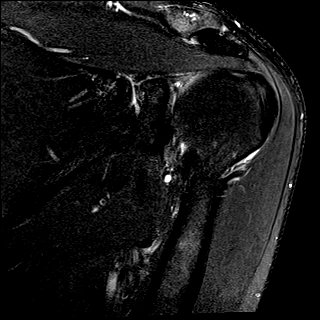
[im 19/23]
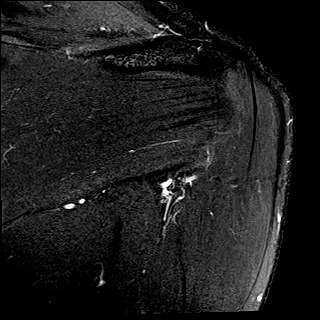
[im 23/23]
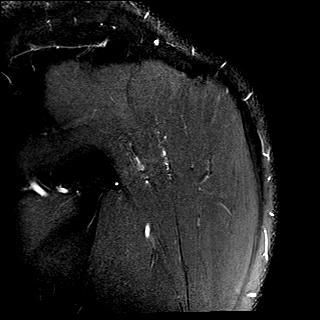

[40 of 40 positions shown; findings below may reference images not displayed]

FINDINGS: Rotator cuff: Moderate tendinosis of the supraspinatus tendon.
Moderate tendinosis of the infraspinatus tendon. Teres minor tendon
is intact. Subscapularis tendon is intact.

Muscles: No muscle atrophy or edema. No intramuscular fluid
collection or hematoma.

Biceps Long Head: Mild tendinosis of the intra-articular portion of
the long head of the biceps tendon.

Acromioclavicular Joint: Mild-moderate arthropathy of the
acromioclavicular joint. No subacromial/subdeltoid bursal fluid.

Glenohumeral Joint: No joint effusion. No chondral defect.

Labrum: Superior labral tear extending into the posterior labrum.

Bones: No fracture or dislocation. No aggressive osseous lesion.

Other: No fluid collection or hematoma.
IMPRESSION: 1. Moderate tendinosis of the supraspinatus and infraspinatus
tendons.
2. Mild tendinosis of the intra-articular portion of the long head
of the biceps tendon.
3. Superior labral tear extending into the posterior labrum.

## 2022-11-09 ENCOUNTER — Encounter: Payer: Self-pay | Admitting: Family Medicine

## 2022-11-22 ENCOUNTER — Other Ambulatory Visit: Payer: Self-pay | Admitting: Family Medicine

## 2022-11-22 MED ORDER — RYBELSUS 7 MG PO TABS: 7.0000 mg | ORAL_TABLET | Freq: Every day | ORAL | 0 refills | Status: DC

## 2022-11-30 ENCOUNTER — Telehealth: Payer: Self-pay

## 2022-11-30 NOTE — Telephone Encounter (Signed)
Initiated Prior authorization [id number] 7MG  tablets Via: Covermymeds Case/Key:[id number] Status: approved as of 11/30/22 Reason:Authorization Expiration Date: November 29, 2025. Notified Pt via: Mychart

## 2022-12-10 ENCOUNTER — Encounter: Payer: Self-pay | Admitting: Family Medicine

## 2022-12-21 ENCOUNTER — Telehealth: Payer: Self-pay | Admitting: Family Medicine

## 2022-12-21 ENCOUNTER — Other Ambulatory Visit: Payer: Self-pay

## 2022-12-21 DIAGNOSIS — Z125 Encounter for screening for malignant neoplasm of prostate: Secondary | ICD-10-CM

## 2022-12-21 DIAGNOSIS — E119 Type 2 diabetes mellitus without complications: Secondary | ICD-10-CM

## 2022-12-21 DIAGNOSIS — E785 Hyperlipidemia, unspecified: Secondary | ICD-10-CM

## 2022-12-21 DIAGNOSIS — Z0000 Encounter for general adult medical examination without abnormal findings: Secondary | ICD-10-CM

## 2022-12-21 NOTE — Telephone Encounter (Signed)
Labs have been placed and sent to Dr. Ashley Royalty for review.

## 2022-12-21 NOTE — Telephone Encounter (Signed)
Patient is scheduled for a physical on December 3rd he is requesting fasting labs prior to his appt date

## 2023-02-08 ENCOUNTER — Encounter: Payer: Self-pay | Admitting: Family Medicine

## 2023-02-21 ENCOUNTER — Other Ambulatory Visit: Payer: Self-pay | Admitting: Family Medicine

## 2023-02-21 DIAGNOSIS — E785 Hyperlipidemia, unspecified: Secondary | ICD-10-CM

## 2023-02-22 ENCOUNTER — Encounter: Payer: 59 | Admitting: Family Medicine

## 2023-02-25 NOTE — Telephone Encounter (Signed)
Hold for 02/26/23 appt.

## 2023-02-26 ENCOUNTER — Encounter: Payer: Self-pay | Admitting: Family Medicine

## 2023-02-26 ENCOUNTER — Ambulatory Visit (INDEPENDENT_AMBULATORY_CARE_PROVIDER_SITE_OTHER): Payer: 59 | Admitting: Family Medicine

## 2023-02-26 VITALS — BP 110/77 | HR 74 | Ht 64.0 in | Wt 145.0 lb

## 2023-02-26 DIAGNOSIS — E119 Type 2 diabetes mellitus without complications: Secondary | ICD-10-CM | POA: Diagnosis not present

## 2023-02-26 DIAGNOSIS — Z23 Encounter for immunization: Secondary | ICD-10-CM | POA: Diagnosis not present

## 2023-02-26 DIAGNOSIS — Z0000 Encounter for general adult medical examination without abnormal findings: Secondary | ICD-10-CM | POA: Diagnosis not present

## 2023-02-26 DIAGNOSIS — E785 Hyperlipidemia, unspecified: Secondary | ICD-10-CM

## 2023-02-26 LAB — CMP14+EGFR
ALT: 28 [IU]/L (ref 0–44)
AST: 23 [IU]/L (ref 0–40)
Albumin: 4.7 g/dL (ref 3.8–4.9)
Alkaline Phosphatase: 78 [IU]/L (ref 44–121)
BUN/Creatinine Ratio: 15 (ref 9–20)
BUN: 16 mg/dL (ref 6–24)
Bilirubin Total: 0.8 mg/dL (ref 0.0–1.2)
CO2: 23 mmol/L (ref 20–29)
Calcium: 9.3 mg/dL (ref 8.7–10.2)
Chloride: 102 mmol/L (ref 96–106)
Creatinine, Ser: 1.04 mg/dL (ref 0.76–1.27)
Globulin, Total: 2 g/dL (ref 1.5–4.5)
Glucose: 116 mg/dL — ABNORMAL HIGH (ref 70–99)
Potassium: 4.4 mmol/L (ref 3.5–5.2)
Sodium: 139 mmol/L (ref 134–144)
Total Protein: 6.7 g/dL (ref 6.0–8.5)
eGFR: 84 mL/min/{1.73_m2} (ref >59)

## 2023-02-26 LAB — LIPID PANEL
Chol/HDL Ratio: 3.2 {ratio} (ref 0.0–5.0)
Cholesterol, Total: 145 mg/dL (ref 100–199)
HDL: 45 mg/dL (ref >39)
LDL Chol Calc (NIH): 73 mg/dL (ref 0–99)
Triglycerides: 157 mg/dL — ABNORMAL HIGH (ref 0–149)
VLDL Cholesterol Cal: 27 mg/dL (ref 5–40)

## 2023-02-26 LAB — HEMOGLOBIN A1C
Est. average glucose Bld gHb Est-mCnc: 140 mg/dL
Hgb A1c MFr Bld: 6.5 % — ABNORMAL HIGH (ref 4.8–5.6)

## 2023-02-26 LAB — CBC
Hematocrit: 47.2 % (ref 37.5–51.0)
Hemoglobin: 15.4 g/dL (ref 13.0–17.7)
MCH: 30.7 pg (ref 26.6–33.0)
MCHC: 32.6 g/dL (ref 31.5–35.7)
MCV: 94 fL (ref 79–97)
Platelets: 209 10*3/uL (ref 150–450)
RBC: 5.02 x10E6/uL (ref 4.14–5.80)
RDW: 12.9 % (ref 11.6–15.4)
WBC: 5.3 10*3/uL (ref 3.4–10.8)

## 2023-02-26 LAB — MICROALBUMIN / CREATININE URINE RATIO
Creatinine, Urine: 197.1 mg/dL
Microalb/Creat Ratio: 3 mg/g{creat} (ref 0–29)
Microalbumin, Urine: 6.7 ug/mL

## 2023-02-26 LAB — PSA, TOTAL AND FREE
PSA, Free Pct: 45.7 %
PSA, Free: 0.32 ng/mL
Prostate Specific Ag, Serum: 0.7 ng/mL (ref 0.0–4.0)

## 2023-02-26 MED ORDER — ATORVASTATIN CALCIUM 40 MG PO TABS: 40.0000 mg | ORAL_TABLET | Freq: Every day | ORAL | 3 refills | Status: DC

## 2023-02-26 MED ORDER — RYBELSUS 7 MG PO TABS
7.0000 mg | ORAL_TABLET | Freq: Every day | ORAL | 2 refills | Status: DC
Start: 2023-02-26 — End: 2023-08-16

## 2023-02-26 NOTE — Patient Instructions (Signed)

## 2023-02-26 NOTE — Assessment & Plan Note (Signed)
Well adult Recent labs reviewed in detail.  Screenings: UTD Immunizations: UTD Anticipatory guidance/Risk factor reduction:  Per AVS.

## 2023-02-26 NOTE — Assessment & Plan Note (Signed)
Diabetes is better controlled.  Continue rybelsus at current strength.  Encouraged continued dietary changes.

## 2023-02-26 NOTE — Progress Notes (Signed)
Darius Roberts - 57 y.o. male MRN [id number]  Date of birth: 1965/05/21  Subjective Chief Complaint  Patient presents with   Immunizations   Eczema    HPI Darius Roberts is a 57 y.o. male here today for annual exam.   He reports that overall he is doing well.  He is monitoring glucose regularly at home and typicall <120 when fasting.  A1c improve to 6.5%.  Overall he is tolerating Rybelsus well.    He is moderately active.  He is eating less and making better food choices  He is a non-smoker.  Rare EtOH use.   Review of Systems  Constitutional:  Negative for chills, fever, malaise/fatigue and weight loss.  HENT:  Negative for congestion, ear pain and sore throat.   Eyes:  Negative for blurred vision, double vision and pain.  Respiratory:  Negative for cough and shortness of breath.   Cardiovascular:  Negative for chest pain and palpitations.  Gastrointestinal:  Negative for abdominal pain, blood in stool, constipation, heartburn and nausea.  Genitourinary:  Negative for dysuria and urgency.  Musculoskeletal:  Negative for joint pain and myalgias.  Neurological:  Negative for dizziness and headaches.  Endo/Heme/Allergies:  Does not bruise/bleed easily.  Psychiatric/Behavioral:  Negative for depression. The patient is not nervous/anxious and does not have insomnia.       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)    Diabetes (HCC) 09/09/2017   Dyslipidemia 07/22/2014   GERD (gastroesophageal reflux disease)     Past Surgical History:  Procedure Laterality Date   KIDNEY DONATION Right     Social History   Socioeconomic History   Marital status: Married    Spouse name: Not on file   Number of children: 3   Years of education: Not on file   Highest education level: Some college,  no degree  Occupational History   Not on file  Tobacco Use   Smoking status: Never   Smokeless tobacco: Never  Vaping Use   Vaping status: Never Used  Substance and Sexual Activity   Alcohol use: Yes    Alcohol/week: 0.0 standard drinks of alcohol   Drug use: Yes   Sexual activity: Yes    Partners: Female  Other Topics Concern   Not on file  Social History Narrative   Not on file   Social Determinants of Health   Financial Resource Strain: Low Risk  (02/25/2023)   Overall Financial Resource Strain (CARDIA)    Difficulty of Paying Living Expenses: Not hard at all  Food Insecurity: No Food Insecurity (02/25/2023)   Hunger Vital Sign    Worried About Running Out of Food in the Last Year: Never true    Ran Out of Food in the Last Year: Never true  Transportation Needs: No Transportation Needs (02/25/2023)   PRAPARE - Administrator, Civil Service (Medical): No    Lack of Transportation (Non-Medical): No  Physical Activity: Insufficiently Active (02/25/2023)   Exercise Vital Sign    Days of Exercise per Week: 3 days    Minutes of Exercise per Session: 20 min  Stress: No Stress Concern Present (02/25/2023)   [employer] of Occupational Health - Occupational Stress Questionnaire    Feeling of Stress : Not at all  Social Connections: Moderately  Integrated (02/25/2023)   Social Connection and Isolation Panel [NHANES]    Frequency of Communication with Friends and Family: More than three times a week    Frequency of Social Gatherings with Friends and Family: Once a week    Attends Religious Services: More than 4 times per year    Active Member of [employer] or Organizations: No    Attends Engineer, structural: Not on file    Marital Status: Married    Family History  Problem Relation Age of Onset   Diabetes Mother    Heart disease Father    Hypertension Maternal Uncle    Hyperlipidemia Paternal Uncle     Health Maintenance  Topic Date Due   COVID-19  Vaccine (4 - 2023-24 season) 11/25/2022   Diabetic kidney evaluation - Urine ACR  02/20/2023   Hepatitis C Screening  08/21/2023 (Originally 12/18/1984)   OPHTHALMOLOGY EXAM  07/12/2023   HEMOGLOBIN A1C  08/26/2023   Diabetic kidney evaluation - eGFR measurement  02/25/2024   FOOT EXAM  02/26/2024   DTaP/Tdap/Td (2 - Td or Tdap) 05/26/2026   Colonoscopy  05/31/2027   INFLUENZA VACCINE  Completed   HIV Screening  Completed   Zoster Vaccines- Shingrix  Completed   HPV VACCINES  Aged Out     ----------------------------------------------------------------------------------------------------------------------------------------------------------------------------------------------------------------- Physical Exam BP 110/77   Pulse 74   Ht 5\' 4"  (1.626 m)   Wt 145 lb (65.8 kg)   SpO2 100%   BMI 24.89 kg/m   Physical Exam Constitutional:      General: He is not in acute distress. HENT:     Head: Normocephalic and atraumatic.     Right Ear: Tympanic membrane and external ear normal.     Left Ear: Tympanic membrane and external ear normal.  Eyes:     General: No scleral icterus. Neck:     Thyroid: No thyromegaly.  Cardiovascular:     Rate and Rhythm: Normal rate and regular rhythm.     Heart sounds: Normal heart sounds.  Pulmonary:     Effort: Pulmonary effort is normal.     Breath sounds: Normal breath sounds.  Abdominal:     General: Bowel sounds are normal. There is no distension.     Palpations: Abdomen is soft.     Tenderness: There is no abdominal tenderness. There is no guarding.  Musculoskeletal:     Cervical back: Normal range of motion.  Lymphadenopathy:     Cervical: No cervical adenopathy.  Skin:    General: Skin is warm and dry.     Findings: No rash.  Neurological:     Mental Status: He is alert and oriented to person, place, and time.     Cranial Nerves: No cranial nerve deficit.     Motor: No abnormal muscle tone.  Psychiatric:        Mood and Affect:  Mood normal.        Behavior: Behavior normal.     ------------------------------------------------------------------------------------------------------------------------------------------------------------------------------------------------------------------- Assessment and Plan  Type 2 diabetes mellitus without complications (HCC) Diabetes is better controlled.  Continue rybelsus at current strength.  Encouraged continued dietary changes.   Well adult exam Well adult Recent labs reviewed in detail.  Screenings: UTD Immunizations: UTD Anticipatory guidance/Risk factor reduction:  Per AVS.   Meds ordered this encounter  Medications   atorvastatin (LIPITOR) 40 MG tablet    Sig: Take 1 tablet (40 mg total) by mouth daily.    Dispense:  90 tablet    Refill:  3   Semaglutide (RYBELSUS) 7 MG TABS    Sig: Take 1 tablet (7 mg total) by mouth daily.    Dispense:  90 tablet    Refill:  2    Return in about 6 months (around 08/27/2023) for Type 2 Diabetes.    This visit occurred during the SARS-CoV-2 public health emergency.  Safety protocols were in place, including screening questions prior to the visit, additional usage of staff PPE, and extensive cleaning of exam room while observing appropriate contact time as indicated for disinfecting solutions.

## 2023-02-28 ENCOUNTER — Encounter: Payer: Self-pay | Admitting: Family Medicine

## 2023-04-26 ENCOUNTER — Encounter: Payer: Self-pay | Admitting: Family Medicine

## 2023-05-16 ENCOUNTER — Encounter: Payer: Self-pay | Admitting: Family Medicine

## 2023-05-16 ENCOUNTER — Telehealth: Payer: Self-pay

## 2023-05-16 NOTE — Telephone Encounter (Signed)
Copied from CRM [id number]. Topic: Clinical - Prescription Issue >> May 14, 2023  3:57 PM Nila Nephew wrote: Reason for CRM: Patient is calling to request an alternative or a price voucher for Rybelsus. Pharmacy states his 30% copayment is going to be $800 each time he picks up a prescription.  Please advise patient.

## 2023-05-16 NOTE — Telephone Encounter (Signed)
Spoke with pharmacist who states a prior authorization is not going to help in this case as is already going through patient insurance. States that the patient could see if a manufacturer coupon would help or medication may need to be changed to cheaper alternative.  Checked and we did have a discount card.  Attempted call to patient- left voice mail message requesting patient return our call . Discount card placed up front in envelope addressed with patient name.

## 2023-05-17 ENCOUNTER — Telehealth: Payer: Self-pay

## 2023-05-17 NOTE — Telephone Encounter (Signed)
Copied from CRM [id number]. Topic: Clinical - Prescription Issue >> May 16, 2023  1:57 PM Nila Nephew wrote: Reason for CRM: Patient calling back for [facility]. Provided base information documented. Patient has questions and would like a call back from [place].

## 2023-05-17 NOTE — Telephone Encounter (Signed)
Patient informed of message via Mychart message by Cherylann Parr.

## 2023-05-22 NOTE — Telephone Encounter (Signed)
 Patient informed of message via Mychart message by Cherylann Parr.

## 2023-07-15 LAB — OPHTHALMOLOGY REPORT-SCANNED

## 2023-08-13 ENCOUNTER — Other Ambulatory Visit: Payer: Self-pay | Admitting: Family Medicine

## 2023-08-13 NOTE — Telephone Encounter (Signed)
 [facility] - Product Backordered/Unavailable.

## 2023-08-16 ENCOUNTER — Other Ambulatory Visit: Payer: Self-pay

## 2023-08-16 DIAGNOSIS — E119 Type 2 diabetes mellitus without complications: Secondary | ICD-10-CM

## 2023-08-16 MED ORDER — RYBELSUS 7 MG PO TABS: 7.0000 mg | ORAL_TABLET | Freq: Every day | ORAL | 1 refills | Status: AC

## 2023-08-16 NOTE — Telephone Encounter (Signed)
 Called the patient, no answer. Left a vm msg for the patient regarding his medication/pharmacy. Direct call back info provided.

## 2023-08-27 ENCOUNTER — Ambulatory Visit: Payer: 59 | Admitting: Family Medicine

## 2023-09-05 ENCOUNTER — Ambulatory Visit: Payer: 59 | Admitting: Family Medicine

## 2023-09-05 ENCOUNTER — Encounter: Payer: Self-pay | Admitting: Family Medicine

## 2023-09-05 VITALS — BP 133/71 | HR 64 | Ht 64.0 in | Wt 144.0 lb

## 2023-09-05 DIAGNOSIS — Z7984 Long term (current) use of oral hypoglycemic drugs: Secondary | ICD-10-CM

## 2023-09-05 DIAGNOSIS — E119 Type 2 diabetes mellitus without complications: Secondary | ICD-10-CM

## 2023-09-05 DIAGNOSIS — E785 Hyperlipidemia, unspecified: Secondary | ICD-10-CM | POA: Diagnosis not present

## 2023-09-05 LAB — POCT GLYCOSYLATED HEMOGLOBIN (HGB A1C): HbA1c, POC (controlled diabetic range): 6.8 % (ref 0.0–7.0)

## 2023-09-05 NOTE — Assessment & Plan Note (Signed)
Doing well with atorvastatin.  Also taking fish oil supplement.  He will continue with current medications.

## 2023-09-05 NOTE — Assessment & Plan Note (Signed)
 Blood sugars remain fairly well controlled.  He will continue Rybelsus  at current strength.  Encouraged incorporation of more activity as well as continued dietary change.  F/u in 6 months.

## 2023-09-05 NOTE — Progress Notes (Signed)
 Darius Roberts - 58 y.o. male MRN [id number]  Date of birth: Nov 18, 1965  Subjective Chief Complaint  Patient presents with   Diabetes    HPI Darius Roberts is a 58 y.o. here today for follow up visit.   He reports that he is doing pretty well  Blood sugars are currently managed with Rybelsus .  He is doing pretty well at current strength of 7mg /daily.  He does have some mild nausea at times but is overall doing well with this.  He controls reflux symptoms with omeprazole.  His A1c is up slightly at 6.8%.  He admits that he could make some changes to his diet.  Tolerating atorvastatin  well for associated HLD.    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)    Diabetes (HCC) 09/09/2017   Dyslipidemia 07/22/2014   GERD (gastroesophageal reflux disease)     Past Surgical History:  Procedure Laterality Date   KIDNEY DONATION Right     Social History   Socioeconomic History   Marital status: Married    Spouse name: Not on file   Number of children: 3   Years of education: Not on file   Highest education level: Some college, no degree  Occupational History   Not on file  Tobacco Use   Smoking status: Never   Smokeless tobacco: Never  Vaping Use   Vaping status: Never Used  Substance and Sexual Activity   Alcohol use: Yes    Alcohol/week: 0.0 standard drinks of alcohol   Drug use: Yes   Sexual activity: Yes    Partners: Female  Other Topics Concern   Not on file  Social History Narrative   Not on file   Social Drivers of Health   Financial Resource Strain: Low Risk  (02/25/2023)   Overall Financial Resource Strain (CARDIA)    Difficulty of Paying Living Expenses: Not hard at all  Food Insecurity: No  Food Insecurity (02/25/2023)   Hunger Vital Sign    Worried About Running Out of Food in the Last Year: Never true    Ran Out of Food in the Last Year: Never true  Transportation Needs: No Transportation Needs (02/25/2023)   PRAPARE - Administrator, Civil Service (Medical): No    Lack of Transportation (Non-Medical): No  Physical Activity: Insufficiently Active (02/25/2023)   Exercise Vital Sign    Days of Exercise per Week: 3 days    Minutes of Exercise per Session: 20 min  Stress: No Stress Concern Present (02/25/2023)   [employer] of Occupational Health - Occupational Stress Questionnaire    Feeling of Stress : Not at all  Social Connections: Moderately Integrated (02/25/2023)   Social Connection and Isolation Panel    Frequency of Communication with Friends and Family: More than three times a week    Frequency of Social Gatherings with Friends and Family: Not on file    Attends Religious Services: More than 4 times per year    Active Member of [employer] or Organizations: No    Attends Engineer, structural: Not on file    Marital Status: Married    Family History  Problem Relation Age of Onset   Diabetes Mother  Heart disease Father    Hypertension Maternal Uncle    Hyperlipidemia Paternal Uncle     Health Maintenance  Topic Date Due   Hepatitis C Screening  Never done   Pneumococcal Vaccine 71-76 Years old (1 of 2 - PCV) Never done   COVID-19 Vaccine (4 - 2024-25 season) 11/25/2022   OPHTHALMOLOGY EXAM  07/12/2023   INFLUENZA VACCINE  10/25/2023   Diabetic kidney evaluation - eGFR measurement  02/25/2024   Diabetic kidney evaluation - Urine ACR  02/25/2024   FOOT EXAM  02/26/2024   HEMOGLOBIN A1C  03/06/2024   DTaP/Tdap/Td (2 - Td or Tdap) 05/26/2026   Colonoscopy  05/31/2027   HIV Screening  Completed   Zoster Vaccines- Shingrix  Completed   HPV VACCINES  Aged Out   Meningococcal B Vaccine  Aged Out      ----------------------------------------------------------------------------------------------------------------------------------------------------------------------------------------------------------------- Physical Exam BP 133/71 (BP Location: Left Arm, Patient Position: Sitting, Cuff Size: Small)   Pulse 64   Ht 5' 4 (1.626 m)   Wt 144 lb (65.3 kg)   SpO2 99%   BMI 24.72 kg/m   Physical Exam Constitutional:      Appearance: Normal appearance.   Cardiovascular:     Rate and Rhythm: Normal rate and regular rhythm.  Pulmonary:     Effort: Pulmonary effort is normal.     Breath sounds: Normal breath sounds.   Neurological:     General: No focal deficit present.     Mental Status: He is alert.   Psychiatric:        Mood and Affect: Mood normal.        Behavior: Behavior normal.     ------------------------------------------------------------------------------------------------------------------------------------------------------------------------------------------------------------------- Assessment and Plan  Type 2 diabetes mellitus without complications (HCC) Blood sugars remain fairly well controlled.  He will continue Rybelsus  at current strength.  Encouraged incorporation of more activity as well as continued dietary change.  F/u in 6 months.   Dyslipidemia Doing well with atorvastatin .  Also taking fish oil supplement.  He will continue with current medications.    No orders of the defined types were placed in this encounter.   Return in about 6 months (around 03/06/2024) for Type 2 Diabetes.

## 2024-02-20 ENCOUNTER — Other Ambulatory Visit: Payer: Self-pay | Admitting: Family Medicine

## 2024-02-20 DIAGNOSIS — E785 Hyperlipidemia, unspecified: Secondary | ICD-10-CM

## 2024-03-06 ENCOUNTER — Encounter: Payer: Self-pay | Admitting: Family Medicine

## 2024-03-06 ENCOUNTER — Ambulatory Visit: Admitting: Family Medicine

## 2024-03-06 VITALS — BP 113/76 | HR 80 | Ht 64.0 in | Wt 142.0 lb

## 2024-03-06 DIAGNOSIS — E119 Type 2 diabetes mellitus without complications: Secondary | ICD-10-CM | POA: Diagnosis not present

## 2024-03-06 DIAGNOSIS — E785 Hyperlipidemia, unspecified: Secondary | ICD-10-CM | POA: Diagnosis not present

## 2024-03-06 DIAGNOSIS — Z0000 Encounter for general adult medical examination without abnormal findings: Secondary | ICD-10-CM

## 2024-03-06 DIAGNOSIS — B079 Viral wart, unspecified: Secondary | ICD-10-CM | POA: Insufficient documentation

## 2024-03-06 DIAGNOSIS — E559 Vitamin D deficiency, unspecified: Secondary | ICD-10-CM

## 2024-03-06 DIAGNOSIS — Z125 Encounter for screening for malignant neoplasm of prostate: Secondary | ICD-10-CM | POA: Diagnosis not present

## 2024-03-06 DIAGNOSIS — K219 Gastro-esophageal reflux disease without esophagitis: Secondary | ICD-10-CM | POA: Diagnosis not present

## 2024-03-06 DIAGNOSIS — B078 Other viral warts: Secondary | ICD-10-CM

## 2024-03-06 LAB — POCT GLYCOSYLATED HEMOGLOBIN (HGB A1C): HbA1c, POC (controlled diabetic range): 6.8 % (ref 0.0–7.0)

## 2024-03-06 NOTE — Assessment & Plan Note (Signed)
 Blood sugars remain fairly well controlled.  He will continue Rybelsus  at current strength.  Encouraged incorporation of more activity as well as continued dietary change.  F/u in 6 months.

## 2024-03-06 NOTE — Assessment & Plan Note (Signed)
 Continue OTC prilosec.

## 2024-03-06 NOTE — Progress Notes (Signed)
 Darius Roberts - 58 y.o. male MRN [id number]  Date of birth: 05/13/1965  Subjective Chief Complaint  Patient presents with   Diabetes    HPI Darius Roberts is a 58 y.o. male here today for follow up visit.   He reports that he is doing well.   He continues on rybelsus  for diabetes management.  A1c today is 6.8%.  Tolerating atorvastatin  well for associated HLD.    Has some intermittent difficulty with swallowing sometimes after eating something with high acidity.  He is taking prilosec for history of GERD.  Denies nausea or vomiting.   He has a wart on the L hand that we would like treated today.    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)    Diabetes (HCC) 09/09/2017   Dyslipidemia 07/22/2014   GERD (gastroesophageal reflux disease)     Past Surgical History:  Procedure Laterality Date   KIDNEY DONATION Right     Social History   Socioeconomic History   Marital status: Married    Spouse name: Not on file   Number of children: 3   Years of education: Not on file   Highest education level: Some college, no degree  Occupational History   Not on file  Tobacco Use   Smoking status: Never   Smokeless tobacco: Never  Vaping Use   Vaping status: Never Used  Substance and Sexual Activity   Alcohol use: Yes    Alcohol/week: 0.0 standard drinks of alcohol   Drug use: Yes   Sexual activity: Yes    Partners: Female  Other Topics Concern   Not on file  Social History Narrative   Not on file   Social Drivers of Health   Tobacco Use: Low Risk (09/05/2023)   Patient History    Smoking Tobacco Use: Never    Smokeless Tobacco Use: Never    Passive Exposure: Not on file  Financial Resource Strain: Low Risk (02/25/2023)   Overall Financial Resource Strain (CARDIA)    Difficulty of Paying Living Expenses: Not hard  at all  Food Insecurity: No Food Insecurity (02/25/2023)   Hunger Vital Sign    Worried About Running Out of Food in the Last Year: Never true    Ran Out of Food in the Last Year: Never true  Transportation Needs: No Transportation Needs (02/25/2023)   PRAPARE - Administrator, Civil Service (Medical): No    Lack of Transportation (Non-Medical): No  Physical Activity: Insufficiently Active (02/25/2023)   Exercise Vital Sign    Days of Exercise per Week: 3 days    Minutes of Exercise per Session: 20 min  Stress: No Stress Concern Present (02/25/2023)   [employer] of Occupational Health - Occupational Stress Questionnaire    Feeling of Stress : Not at all  Social Connections: Moderately Integrated (02/25/2023)   Social Connection and Isolation Panel    Frequency of Communication with Friends and Family: More than three times a week    Frequency of Social Gatherings with Friends and Family: Not on file    Attends Religious Services: More than 4 times per year    Active Member of Clubs or Organizations: No    Attends Banker Meetings: Not on file    Marital Status: Married  Depression (PHQ2-9): Low Risk (03/06/2024)  Depression (PHQ2-9)    PHQ-2 Score: 0  Alcohol Screen: Low Risk (02/25/2023)   Alcohol Screen    Last Alcohol Screening Score (AUDIT): 0  Housing: Low Risk (02/25/2023)   Housing    Last Housing Risk Score: 0  Utilities: Not At Risk (09/05/2023)   Epic    Threatened with loss of utilities: No  Health Literacy: Adequate Health Literacy (09/05/2023)   B1300 Health Literacy    Frequency of need for help with medical instructions: Never    Family History  Problem Relation Age of Onset   Diabetes Mother    Heart disease Father    Hypertension Maternal Uncle    Hyperlipidemia Paternal Uncle     Health Maintenance  Topic Date Due   OPHTHALMOLOGY EXAM  Never done   Hepatitis C Screening  Never done   Influenza Vaccine  10/25/2023    Diabetic kidney evaluation - eGFR measurement  02/25/2024   Diabetic kidney evaluation - Urine ACR  02/25/2024   HEMOGLOBIN A1C  03/06/2024   Pneumococcal Vaccine: 50+ Years (1 of 2 - PCV) 03/06/2025 (Originally 12/18/1985)   Hepatitis B Vaccines 19-59 Average Risk (1 of 3 - 19+ 3-dose series) 03/06/2025 (Originally 12/18/1985)   COVID-19 Vaccine (4 - 2025-26 season) 03/22/2025 (Originally 11/25/2023)   FOOT EXAM  03/06/2025   DTaP/Tdap/Td (2 - Td or Tdap) 05/26/2026   Colonoscopy  05/31/2027   HIV Screening  Completed   Zoster Vaccines- Shingrix  Completed   HPV VACCINES  Aged Out   Meningococcal B Vaccine  Aged Out     ----------------------------------------------------------------------------------------------------------------------------------------------------------------------------------------------------------------- Physical Exam BP 113/76 (BP Location: Left Arm, Patient Position: Sitting, Cuff Size: Normal)   Pulse 80   Ht 5' 4 (1.626 m)   Wt 142 lb (64.4 kg)   SpO2 97%   BMI 24.37 kg/m   Physical Exam Constitutional:      Appearance: Normal appearance.  Eyes:     General: No scleral icterus. Cardiovascular:     Rate and Rhythm: Normal rate and regular rhythm.  Pulmonary:     Effort: Pulmonary effort is normal.     Breath sounds: Normal breath sounds.  Neurological:     Mental Status: He is alert.  Psychiatric:        Mood and Affect: Mood normal.        Behavior: Behavior normal.      Procedure note:  Verbal consent given for cryo treatment to wart on L hand.  Treated with liquid nitrogen x3 cycles.  2mm frost ring with each cycle.  He tolerated this well.   ROS:  A comprehensive ROS was completed and negative except as noted per HPI  ------------------------------------------------------------------------------------------------------------------------------------------------------------------------------------------------------------------- Assessment and  Plan  Type 2 diabetes mellitus without complications (HCC) Blood sugars remain fairly well controlled.  He will continue Rybelsus  at current strength.  Encouraged incorporation of more activity as well as continued dietary change.  F/u in 6 months.   Avitaminosis D Update vitamin d  levels.   Dyslipidemia Doing well with atorvastatin .  Also taking fish oil supplement.  He will continue with current medications.   Acid reflux Continue OTC prilosec.   Wart Treated with liquid nitrogen.  See procedure note.    No orders of the defined types were placed in this encounter.   No follow-ups on file.        [1]  Allergies Allergen Reactions   Amoxicillin Rash   Promethazine Other (See Comments)    Extreme nausea/stomach upset

## 2024-03-06 NOTE — Assessment & Plan Note (Signed)
Treated with liquid nitrogen.  See procedure note.  

## 2024-03-06 NOTE — Assessment & Plan Note (Signed)
 Update vitamin d levels.

## 2024-03-06 NOTE — Assessment & Plan Note (Signed)
Doing well with atorvastatin.  Also taking fish oil supplement.  He will continue with current medications.

## 2024-03-07 LAB — LIPID PANEL WITH LDL/HDL RATIO
Cholesterol, Total: 138 mg/dL (ref 100–199)
HDL: 48 mg/dL (ref >39)
LDL Chol Calc (NIH): 71 mg/dL (ref 0–99)
LDL/HDL Ratio: 1.5 ratio (ref 0.0–3.6)
Triglycerides: 106 mg/dL (ref 0–149)
VLDL Cholesterol Cal: 19 mg/dL (ref 5–40)

## 2024-03-07 LAB — CBC WITH DIFFERENTIAL/PLATELET
Basophils Absolute: 0 x10E3/uL (ref 0.0–0.2)
Basos: 1 %
EOS (ABSOLUTE): 0.2 x10E3/uL (ref 0.0–0.4)
Eos: 4 %
Hematocrit: 47.8 % (ref 37.5–51.0)
Hemoglobin: 15.5 g/dL (ref 13.0–17.7)
Immature Grans (Abs): 0 x10E3/uL (ref 0.0–0.1)
Immature Granulocytes: 0 %
Lymphocytes Absolute: 1.2 x10E3/uL (ref 0.7–3.1)
Lymphs: 27 %
MCH: 30 pg (ref 26.6–33.0)
MCHC: 32.4 g/dL (ref 31.5–35.7)
MCV: 93 fL (ref 79–97)
Monocytes Absolute: 0.3 x10E3/uL (ref 0.1–0.9)
Monocytes: 7 %
Neutrophils Absolute: 2.8 x10E3/uL (ref 1.4–7.0)
Neutrophils: 61 %
Platelets: 205 x10E3/uL (ref 150–450)
RBC: 5.16 x10E6/uL (ref 4.14–5.80)
RDW: 12.2 % (ref 11.6–15.4)
WBC: 4.6 x10E3/uL (ref 3.4–10.8)

## 2024-03-07 LAB — VITAMIN D 25 HYDROXY (VIT D DEFICIENCY, FRACTURES): Vit D, 25-Hydroxy: 22.1 ng/mL — ABNORMAL LOW (ref 30.0–100.0)

## 2024-03-07 LAB — PSA, TOTAL AND FREE
PSA, Free Pct: 42.9 %
PSA, Free: 0.3 ng/mL
Prostate Specific Ag, Serum: 0.7 ng/mL (ref 0.0–4.0)

## 2024-03-07 LAB — CMP14+EGFR
ALT: 30 IU/L (ref 0–44)
AST: 22 IU/L (ref 0–40)
Albumin: 4.6 g/dL (ref 3.8–4.9)
Alkaline Phosphatase: 91 IU/L (ref 47–123)
BUN/Creatinine Ratio: 20 (ref 9–20)
BUN: 18 mg/dL (ref 6–24)
Bilirubin Total: 1 mg/dL (ref 0.0–1.2)
CO2: 25 mmol/L (ref 20–29)
Calcium: 9.5 mg/dL (ref 8.7–10.2)
Chloride: 103 mmol/L (ref 96–106)
Creatinine, Ser: 0.88 mg/dL (ref 0.76–1.27)
Globulin, Total: 2 g/dL (ref 1.5–4.5)
Glucose: 123 mg/dL — ABNORMAL HIGH (ref 70–99)
Potassium: 5 mmol/L (ref 3.5–5.2)
Sodium: 141 mmol/L (ref 134–144)
Total Protein: 6.6 g/dL (ref 6.0–8.5)
eGFR: 100 mL/min/1.73 (ref >59)

## 2024-03-07 LAB — MICROALBUMIN / CREATININE URINE RATIO
Creatinine, Urine: 45.7 mg/dL
Microalb/Creat Ratio: <7 mg/g{creat} (ref 0–29)
Microalbumin, Urine: <3 ug/mL

## 2024-03-19 ENCOUNTER — Ambulatory Visit: Payer: Self-pay | Admitting: Family Medicine

## 2024-03-25 MED ORDER — ALBUTEROL SULFATE HFA 108 (90 BASE) MCG/ACT IN AERS: 1.0000 | INHALATION_SPRAY | Freq: Four times a day (QID) | RESPIRATORY_TRACT | 0 refills | Status: DC | PRN

## 2024-03-25 NOTE — Telephone Encounter (Signed)
 Requesting rx rf of Albuterol  HFA Not showing in current med list  Last written 06/13/2022 by outside provider  Last OV 03/06/2024 Upcoming appt 09/04/2024

## 2024-04-19 ENCOUNTER — Other Ambulatory Visit: Payer: Self-pay | Admitting: Family Medicine

## 2024-09-04 ENCOUNTER — Ambulatory Visit: Admitting: Family Medicine
